# Patient Record
Sex: Male | Born: 1995
Health system: Southern US, Community
[De-identification: ages and names within clinical notes are randomized; demographics above are authoritative.]

## PROBLEM LIST (undated history)

## (undated) DIAGNOSIS — F419 Anxiety disorder, unspecified: Secondary | ICD-10-CM

## (undated) DIAGNOSIS — S42009A Fracture of unspecified part of unspecified clavicle, initial encounter for closed fracture: Secondary | ICD-10-CM

## (undated) DIAGNOSIS — F909 Attention-deficit hyperactivity disorder, unspecified type: Secondary | ICD-10-CM

---
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
## 2013-05-12 ENCOUNTER — Emergency Department: Payer: Self-pay | Admitting: Emergency Medicine

## 2015-11-23 ENCOUNTER — Ambulatory Visit (INDEPENDENT_AMBULATORY_CARE_PROVIDER_SITE_OTHER): Payer: BLUE CROSS/BLUE SHIELD | Admitting: Family Medicine

## 2015-11-23 ENCOUNTER — Encounter: Payer: Self-pay | Admitting: Family Medicine

## 2015-11-23 VITALS — BP 118/72 | HR 73 | Temp 98.0°F | Ht 70.5 in | Wt 181.4 lb

## 2015-11-23 DIAGNOSIS — Z114 Encounter for screening for human immunodeficiency virus [HIV]: Secondary | ICD-10-CM | POA: Diagnosis not present

## 2015-11-23 DIAGNOSIS — Z13220 Encounter for screening for lipoid disorders: Secondary | ICD-10-CM | POA: Diagnosis not present

## 2015-11-23 DIAGNOSIS — Z0001 Encounter for general adult medical examination with abnormal findings: Secondary | ICD-10-CM | POA: Diagnosis not present

## 2015-11-23 DIAGNOSIS — Z23 Encounter for immunization: Secondary | ICD-10-CM | POA: Diagnosis not present

## 2015-11-23 DIAGNOSIS — M5136 Other intervertebral disc degeneration, lumbar region: Secondary | ICD-10-CM | POA: Diagnosis not present

## 2015-11-23 DIAGNOSIS — R7989 Other specified abnormal findings of blood chemistry: Secondary | ICD-10-CM

## 2015-11-23 DIAGNOSIS — M51369 Other intervertebral disc degeneration, lumbar region without mention of lumbar back pain or lower extremity pain: Secondary | ICD-10-CM | POA: Insufficient documentation

## 2015-11-23 LAB — COMPREHENSIVE METABOLIC PANEL
ALT: 13 U/L (ref 0–53)
AST: 16 U/L (ref 0–37)
Albumin: 4.7 g/dL (ref 3.5–5.2)
Alkaline Phosphatase: 48 U/L (ref 39–117)
BILIRUBIN TOTAL: 0.5 mg/dL (ref 0.2–1.2)
BUN: 12 mg/dL (ref 6–23)
CHLORIDE: 104 meq/L (ref 96–112)
CO2: 29 mEq/L (ref 19–32)
CREATININE: 0.97 mg/dL (ref 0.40–1.50)
Calcium: 9.2 mg/dL (ref 8.4–10.5)
GFR: 124.38 mL/min (ref >60.00)
GLUCOSE: 99 mg/dL (ref 70–99)
Potassium: 4.4 mEq/L (ref 3.5–5.1)
Sodium: 140 mEq/L (ref 135–145)
Total Protein: 7.1 g/dL (ref 6.0–8.3)

## 2015-11-23 LAB — LIPID PANEL
CHOL/HDL RATIO: 4
Cholesterol: 148 mg/dL (ref 0–200)
HDL: 36.1 mg/dL — AB (ref >39.00)
NONHDL: 111.81
Triglycerides: 294 mg/dL — ABNORMAL HIGH (ref 0.0–149.0)
VLDL: 58.8 mg/dL — ABNORMAL HIGH (ref 0.0–40.0)

## 2015-11-23 LAB — LDL CHOLESTEROL, DIRECT: LDL DIRECT: 81 mg/dL

## 2015-11-23 NOTE — Assessment & Plan Note (Signed)
Overall quite healthy. Slightly overweight. Discussed diet and exercise. Tdap updated today. HIV checked. Lab work as outlined below.

## 2015-11-23 NOTE — Patient Instructions (Signed)
Nice to meet you. Please work on exercising.  We will check lab work today and call with the results.  If you develop worsening back pain, numbness, weakness, loss of bowel or bladder function, numbness between her legs, fevers, or any new or changing symptoms please seek medical attention.

## 2015-11-23 NOTE — Progress Notes (Signed)
Patient ID: James Long, male   DOB: Aug 02, 1995, 20 y.o.   MRN: [id number]  Tommi Rumps, MD Phone: [phone]  James Long is a 20 y.o. male who presents today for new patient visit and physical exam.  Patient reports only chronic medical issue is degenerative disc disease in his lower back. Has been evaluated by a chiropractor previously. Intermittently bothersome particularly when the weather changes. No radiation down his legs. No numbness, weakness, loss of bowel or bladder function, saddle anesthesia, fevers, or history of cancer. Typically doesn't take any medicines for this. Does not exercise. Plans to start soon. Diet is described as normal. Drinks more water than anything. Not much fast food or fried foods. Last tetanus vaccine was likely in middle school though he is unsure. He does not report getting one recently. No prior HIV screening. Drinks 4-5 alcoholic drinks a week on average. Quit smoking 8 days ago. Smoked about a pack a day for 4-5 years. No illicit drug use.  Active Ambulatory Problems    Diagnosis Date Noted  . Encounter for general adult medical examination with abnormal findings 11/23/2015  . DDD (degenerative disc disease), lumbar 11/23/2015   Resolved Ambulatory Problems    Diagnosis Date Noted  . No Resolved Ambulatory Problems   No Additional Past Medical History    History reviewed. No pertinent family history.  Social History   Social History  . Marital Status: Single    Spouse Name: N/A  . Number of Children: 0  . Years of Education: HS   Occupational History  . Salesman [employer]   Social History Main Topics  . Smoking status: Former Smoker -- 1.00 packs/day for 6 years    Types: Cigarettes    Quit date: 11/16/2015  . Smokeless tobacco: Never Used  . Alcohol Use: 0.0 oz/week    0 Standard drinks or equivalent per week     Comment: 4-5 week if any  . Drug Use: No  . Sexual Activity: Not on file   Other Topics  Concern  . Not on file   Social History Narrative  . No narrative on file    ROS  General:  Negative for nexplained weight loss, fever Skin: Negative for new or changing mole, sore that won't heal HEENT: Negative for trouble hearing, trouble seeing, ringing in ears, mouth sores, hoarseness, change in voice, dysphagia. CV:  Negative for chest pain, dyspnea, edema, palpitations Resp: Negative for cough, dyspnea, hemoptysis GI: Negative for nausea, vomiting, diarrhea, constipation, abdominal pain, melena, hematochezia. GU: Negative for dysuria, incontinence, urinary hesitance, hematuria, vaginal or penile discharge, polyuria, sexual difficulty, lumps in testicle or breasts MSK: Negative for muscle cramps or aches, joint pain or swelling Neuro: Negative for headaches, weakness, numbness, dizziness, passing out/fainting Psych: Negative for depression, anxiety, memory problems  Objective  Physical Exam Filed Vitals:   11/23/15 0805  BP: 118/72  Pulse: 73  Temp: 98 F (36.7 C)    BP Readings from Last 3 Encounters:  11/23/15 118/72   Wt Readings from Last 3 Encounters:  11/23/15 181 lb 6.4 oz (82.283 kg)    Physical Exam  Constitutional: He is well-developed, well-nourished, and in no distress.  HENT:  Head: Normocephalic and atraumatic.  Right Ear: External ear normal.  Left Ear: External ear normal.  Mouth/Throat: Oropharynx is clear and moist. No oropharyngeal exudate.  Eyes: Conjunctivae are normal. Pupils are equal, round, and reactive to light.  Cardiovascular: Normal rate, regular rhythm and normal heart sounds.  Pulmonary/Chest: Effort normal and breath sounds normal.  Abdominal: Soft. Bowel sounds are normal. He exhibits no distension. There is no tenderness. There is no rebound.  Musculoskeletal: He exhibits no edema.  No midline spine tenderness, no midline spine step-off, no muscular back tenderness, no skin changes and low back  Neurological: He is alert.  Gait normal.  5 out of 5 strength bilateral quads, hamstrings, plantar flexion, and dorsiflexion, sensation light touch intact bilaterally extremities, 2+ patellar reflexes  Skin: Skin is warm and dry. He is not diaphoretic.  Psychiatric: Mood and affect normal.     Assessment/Plan:   Encounter for general adult medical examination with abnormal findings Overall quite healthy. Slightly overweight. Discussed diet and exercise. Tdap updated today. HIV checked. Lab work as outlined below.  DDD (degenerative disc disease), lumbar Patient reports lumbar degenerative disc disease. Asymptomatic at this time. No red flags. Neurologically intact in lower extremities. He'll continue to monitor. Given return precautions.    Orders Placed This Encounter  Procedures  . Tdap vaccine greater than or equal to 7yo IM  . HIV antibody (with reflex)  . Comp Met (CMET)  . Lipid Profile    No orders of the defined types were placed in this encounter.     Tommi Rumps, MD [place]

## 2015-11-23 NOTE — Progress Notes (Signed)
Pre visit review using our clinic review tool, if applicable. No additional management support is needed unless otherwise documented below in the visit note. 

## 2015-11-23 NOTE — Assessment & Plan Note (Signed)
Patient reports lumbar degenerative disc disease. Asymptomatic at this time. No red flags. Neurologically intact in lower extremities. He'll continue to monitor. Given return precautions.

## 2015-11-24 LAB — HIV ANTIBODY (ROUTINE TESTING W REFLEX): HIV: NONREACTIVE

## 2015-11-28 ENCOUNTER — Encounter: Payer: Self-pay | Admitting: Family Medicine

## 2016-02-02 ENCOUNTER — Encounter (INDEPENDENT_AMBULATORY_CARE_PROVIDER_SITE_OTHER): Payer: Self-pay

## 2016-02-02 ENCOUNTER — Ambulatory Visit (INDEPENDENT_AMBULATORY_CARE_PROVIDER_SITE_OTHER): Payer: BLUE CROSS/BLUE SHIELD | Admitting: Family Medicine

## 2016-02-02 DIAGNOSIS — G479 Sleep disorder, unspecified: Secondary | ICD-10-CM | POA: Diagnosis not present

## 2016-02-02 NOTE — Progress Notes (Signed)
Pre visit review using our clinic review tool, if applicable. No additional management support is needed unless otherwise documented below in the visit note. 

## 2016-02-02 NOTE — Patient Instructions (Signed)
Nice to see you. Things you can do to help your sleep are take a warm shower prior to bed, set a set bedtime. Read prior to bed. And discuss your stresses and anxieties with someone close. If you develop depression or anxiety please let us know.

## 2016-02-04 DIAGNOSIS — G479 Sleep disorder, unspecified: Secondary | ICD-10-CM | POA: Insufficient documentation

## 2016-02-04 NOTE — Assessment & Plan Note (Signed)
Suspect likely related to recent stressors with one of his acquaintances. Discussed talking about this with those around him. Discussed sleep hygiene. Discussed if this does not improve could consider the therapist if it is anxiety related or medication such as SSRI. He will continue to monitor.

## 2016-02-04 NOTE — Progress Notes (Signed)
  Marikay Alar, MD Phone: [phone]  James Long is a 20 y.o. male who presents today for same-day visit.  Patient notes this past Sunday he had issues sleeping. Laid down at 11:30 PM and did not fall sleep. He had to get up around 6:30. The next night the same thing happened though he got up at 2 AM and drank a beer to help him sleep. Has not had issues last several nights. Has had issues with this in the past when he has had stressors related to male appointment wheezes. He notes similar stressors recently. No depression or anxiety. Typically does not drink alcohol before bed. Does not drink much caffeine. Tries to play guitar before he goes to bed to help calm himself down.   ROS see history of present illness  Objective  Physical Exam Vitals:   02/02/16 1614  BP: 110/74  Pulse: 89  Temp: 98.4 F (36.9 C)    BP Readings from Last 3 Encounters:  02/02/16 110/74  11/23/15 118/72   Wt Readings from Last 3 Encounters:  02/02/16 184 lb 3.2 oz (83.6 kg)  11/23/15 181 lb 6.4 oz (82.3 kg)    Physical Exam  Constitutional: He is well-developed, well-nourished, and in no distress.  HENT:  Head: Normocephalic and atraumatic.  Cardiovascular: Normal rate and regular rhythm.   Pulmonary/Chest: Effort normal and breath sounds normal.  Neurological: He is alert. Gait normal.  Psychiatric: Mood and affect normal.     Assessment/Plan: Please see individual problem list.  Sleeping difficulty Suspect likely related to recent stressors with one of his acquaintances. Discussed talking about this with those around him. Discussed sleep hygiene. Discussed if this does not improve could consider the therapist if it is anxiety related or medication such as SSRI. He will continue to monitor.   No orders of the defined types were placed in this encounter.   No orders of the defined types were placed in this encounter.   Marikay Alar, MD [facility]

## 2016-11-25 ENCOUNTER — Encounter: Payer: BLUE CROSS/BLUE SHIELD | Admitting: Family Medicine

## 2016-11-25 DIAGNOSIS — Z0289 Encounter for other administrative examinations: Secondary | ICD-10-CM

## 2017-04-25 ENCOUNTER — Encounter: Payer: Self-pay | Admitting: Family Medicine

## 2017-04-25 ENCOUNTER — Ambulatory Visit (INDEPENDENT_AMBULATORY_CARE_PROVIDER_SITE_OTHER): Payer: BLUE CROSS/BLUE SHIELD | Admitting: Family Medicine

## 2017-04-25 VITALS — BP 136/80 | HR 97 | Temp 97.9°F | Wt 160.8 lb

## 2017-04-25 DIAGNOSIS — R41840 Attention and concentration deficit: Secondary | ICD-10-CM | POA: Diagnosis not present

## 2017-04-25 DIAGNOSIS — F909 Attention-deficit hyperactivity disorder, unspecified type: Secondary | ICD-10-CM | POA: Insufficient documentation

## 2017-04-25 NOTE — Progress Notes (Signed)
  Marikay AlarEric Kavontae Pritchard, MD Phone: [phone]  James Long is a 21 y.o. male who presents today for same-day visit.  Patient reports issues with focusing and attention over the last several months. Notes he's had lots of stress at work and has to focus much more than he used to. 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 Has had trouble focusing at home as well. No prior evaluation for ADD. He denies anxiety and depression. He does note some grief at the loss of someone close to him earlier this year though no depression. No SI. He did take Vyvanse yesterday that he got from a friend and it did help significantly.  ROS see history of present illness  Objective  Physical Exam Vitals:   04/25/17 1316  BP: 136/80  Pulse: 97  Temp: 97.9 F (36.6 C)  SpO2: 97%    BP Readings from Last 3 Encounters:  04/25/17 136/80  02/02/16 110/74  11/23/15 118/72   Wt Readings from Last 3 Encounters:  04/25/17 160 lb 12.8 oz (72.9 kg)  02/02/16 184 lb 3.2 oz (83.6 kg)  11/23/15 181 lb 6.4 oz (82.3 kg)    Physical Exam  Constitutional: No distress.  Cardiovascular: Normal rate, regular rhythm and normal heart sounds.   Pulmonary/Chest: Effort normal and breath sounds normal.  Skin: He is not diaphoretic.     Assessment/Plan: Please see individual problem list.  Attention deficit Concern for attention deficit disorder. Denies anxiety and depression. We'll refer to psychology for ADD evaluation.   Orders Placed This Encounter  Procedures  . Ambulatory referral to Psychology    Referral Priority:   Routine    Referral Type:   Psychiatric    Referral Reason:   Specialty Services Required    Requested Specialty:   Psychology    Number of Visits Requested:   1   Marikay AlarEric Wava Kildow, MD [facility] [place]

## 2017-04-25 NOTE — Assessment & Plan Note (Signed)
Concern for attention deficit disorder. Denies anxiety and depression. We'll refer to psychology for ADD evaluation.

## 2017-04-25 NOTE — Patient Instructions (Signed)
Nice to see you. We'll get you referred to psychology for evaluation for attention deficit disorder.

## 2017-07-18 ENCOUNTER — Ambulatory Visit (INDEPENDENT_AMBULATORY_CARE_PROVIDER_SITE_OTHER): Payer: BLUE CROSS/BLUE SHIELD | Admitting: Psychology

## 2017-07-18 DIAGNOSIS — F900 Attention-deficit hyperactivity disorder, predominantly inattentive type: Secondary | ICD-10-CM | POA: Diagnosis not present

## 2017-07-18 DIAGNOSIS — F419 Anxiety disorder, unspecified: Secondary | ICD-10-CM

## 2017-07-18 DIAGNOSIS — F411 Generalized anxiety disorder: Secondary | ICD-10-CM

## 2017-07-19 ENCOUNTER — Telehealth: Payer: Self-pay | Admitting: Family Medicine

## 2017-07-19 NOTE — Telephone Encounter (Signed)
I received the below message from the patient's psychologist.  Please contact the patient to see if we can set him up for an office visit to discuss options for treatment of ADHD and anxiety.  Thanks.      "Patient seen for testing session. Patient presents with history of attention deficits, distractibility, poor organization, and physical restlessness. Frequent anxiety and possible emotional trauma may also be contributing to symptoms. Testing recommended to rule out ADHD as well as level of emotional discomfort. Tests conducted included the CNS Vital signs, along with ratings for depression, anxiety, and PTSD. Results indicated significant defects in attention and organization, along with significant worries related to task completion, time management, and relationships. Patient meets criterion for attention Deficit hyperactivity disorder (ADHD) Primarily Inattentive Presentation and Generalized anxiety disorder. Recommendations were to discuss medication options for attention and anxiety with Primary Care physician along with developing and organizational system and consideration of individual counseling for stress management.     Salvatore DecentSteven C. Altabet, Ph.D. "Marland Kitchen

## 2017-07-21 NOTE — Telephone Encounter (Signed)
Patient is scheduled   

## 2017-08-14 ENCOUNTER — Ambulatory Visit (INDEPENDENT_AMBULATORY_CARE_PROVIDER_SITE_OTHER): Payer: BLUE CROSS/BLUE SHIELD | Admitting: Family Medicine

## 2017-08-14 ENCOUNTER — Other Ambulatory Visit: Payer: Self-pay

## 2017-08-14 ENCOUNTER — Encounter: Payer: Self-pay | Admitting: Family Medicine

## 2017-08-14 DIAGNOSIS — F419 Anxiety disorder, unspecified: Secondary | ICD-10-CM | POA: Diagnosis not present

## 2017-08-14 DIAGNOSIS — F909 Attention-deficit hyperactivity disorder, unspecified type: Secondary | ICD-10-CM | POA: Diagnosis not present

## 2017-08-14 MED ORDER — LISDEXAMFETAMINE DIMESYLATE 30 MG PO CAPS
30.0000 mg | ORAL_CAPSULE | Freq: Every day | ORAL | 0 refills | Status: DC
Start: 2017-08-14 — End: 2017-12-15

## 2017-08-14 MED ORDER — LISDEXAMFETAMINE DIMESYLATE 30 MG PO CAPS: 30.0000 mg | ORAL_CAPSULE | Freq: Every day | ORAL | 0 refills | Status: DC

## 2017-08-14 NOTE — Assessment & Plan Note (Signed)
Patient has undergone psychological evaluation.  Found to have attention deficit disorder.  He would benefit from a trial of stimulant therapy.  We discussed multiple options and he wanted once daily dosing.  We will trial Vyvanse.  Discussed potential side effects.  If these develop he will let us know.  He will monitor his anxiety while on this.  Follow-up in 1 month.

## 2017-08-14 NOTE — Patient Instructions (Signed)
Nice to see you. We will try Vyvanse for your ADHD.  If you develop any of the side effects that we discussed please let us know. Please monitor your anxiety while taking this.  If he gets worse please let us know.

## 2017-08-14 NOTE — Progress Notes (Signed)
Marikay James Kennis Wissmann, MD Phone: [phone]  James Long is a 22 y.o. male who presents today for follow-up.  ADHD: Recently underwent evaluation with psychology.  Found to have ADHD.  Felt he could benefit from a medication trial.  He on one occasion took a dose of 30 mg of Vyvanse and found it very helpful.  He would be interested in trying that.  He has issues concentrating at home and blood pressure issues.  No palpitations.  He tolerated that dose of Vyvanse well.  Anxiety: Notes this came out while he was seeing the psychologist.  Has anxiety when talking to certain people at work and also when driving.  Also noted to have a little bit of PTSD due to childhood issues.  He does not have any nightmares or read those issues.  Does have some grief related to the loss of someone close to him last year though no depression.  Social History   Tobacco Use  Smoking Status Current Some Day Smoker  . Packs/day: 1.00  . Years: 6.00  . Pack years: 6.00  . Types: Cigarettes  . Last attempt to quit: 11/16/2015  . Years since quitting: 1.7  Smokeless Tobacco Never Used     ROS see history of present illness  Objective  Physical Exam Vitals:   08/14/17 0928  BP: 104/78  Pulse: 78  Temp: 98 F (36.7 C)  SpO2: 98%    BP Readings from Last 3 Encounters:  08/14/17 104/78  04/25/17 136/80  02/02/16 110/74   Wt Readings from Last 3 Encounters:  08/14/17 155 lb 3.2 oz (70.4 kg)  04/25/17 160 lb 12.8 oz (72.9 kg)  02/02/16 184 lb 3.2 oz (83.6 kg)    Physical Exam  Constitutional: No distress.  Cardiovascular: Normal rate, regular rhythm and normal heart sounds.  Pulmonary/Chest: Effort normal and breath sounds normal.  Musculoskeletal: He exhibits no edema.  Neurological: He is alert. Gait normal.  Skin: Skin is warm and dry. He is not diaphoretic.   Small hyperpigmented spot off the edge connected to his right iris at about the 4 o'clock position that is been present since he  was born and has been unchanged, he notes no issues with his eyes. He will monitor and if changing be evaluated.   Assessment/Plan: Please see individual problem list.  ADHD Patient has undergone psychological evaluation.  Found to have attention deficit disorder.  He would benefit from a trial of stimulant therapy.  We discussed multiple options and he wanted once daily dosing.  We will trial Vyvanse.  Discussed potential side effects.  If these develop he will let us know.  He will monitor his anxiety while on this.  Follow-up in 1 month.  Anxiety Patient with anxiety.  Discussed treatment options including therapy and medication.  He deferred this at this time as he feels like getting the ADHD under control may help his anxiety.  If his anxiety worsens he will let us know.   No orders of the defined types were placed in this encounter.   Meds ordered this encounter  Medications  . lisdexamfetamine (VYVANSE) 30 MG capsule    Sig: Take 1 capsule (30 mg total) by mouth daily.    Dispense:  30 capsule    Refill:  0  . lisdexamfetamine (VYVANSE) 30 MG capsule    Sig: Take 1 capsule (30 mg total) by mouth daily. Do not fill until 09/11/17    Dispense:  30 capsule    Refill:  0     Tommi Rumps, MD [place]

## 2017-08-14 NOTE — Assessment & Plan Note (Signed)
Patient with anxiety.  Discussed treatment options including therapy and medication.  He deferred this at this time as he feels like getting the ADHD under control may help his anxiety.  If his anxiety worsens he will let us know.

## 2017-09-12 ENCOUNTER — Encounter: Payer: Self-pay | Admitting: Family Medicine

## 2017-09-12 ENCOUNTER — Ambulatory Visit (INDEPENDENT_AMBULATORY_CARE_PROVIDER_SITE_OTHER): Payer: BLUE CROSS/BLUE SHIELD | Admitting: Family Medicine

## 2017-09-12 DIAGNOSIS — F909 Attention-deficit hyperactivity disorder, unspecified type: Secondary | ICD-10-CM | POA: Diagnosis not present

## 2017-09-12 DIAGNOSIS — F419 Anxiety disorder, unspecified: Secondary | ICD-10-CM

## 2017-09-12 MED ORDER — LISDEXAMFETAMINE DIMESYLATE 30 MG PO CAPS: 30.0000 mg | ORAL_CAPSULE | Freq: Every day | ORAL | 0 refills | Status: DC

## 2017-09-12 MED ORDER — LISDEXAMFETAMINE DIMESYLATE 30 MG PO CAPS
30.0000 mg | ORAL_CAPSULE | Freq: Every day | ORAL | 0 refills | Status: DC
Start: 2017-11-12 — End: 2017-12-15

## 2017-09-12 NOTE — Progress Notes (Signed)
  James AlarEric Iann Rodier, MD Phone: [phone]  James Long is a 22 y.o. male who presents today for follow-up.  ADHD: Taking Vyvanse and this is been very beneficial.  Has been much more productive at work.  Does not take on the weekends.  No palpitations, sleep changes, or appetite changes.   Anxiety has improved with starting ADHD treatment.   Social History   Tobacco Use  Smoking Status Former Smoker  . Packs/day: 1.00  . Years: 6.00  . Pack years: 6.00  . Types: Cigarettes  . Last attempt to quit: 11/16/2015  . Years since quitting: 1.8  Smokeless Tobacco Never Used     ROS see history of present illness  Objective  Physical Exam Vitals:   09/12/17 1519  BP: 118/84  Pulse: 89  Temp: 98.3 F (36.8 C)  SpO2: 99%    BP Readings from Last 3 Encounters:  09/12/17 118/84  08/14/17 104/78  04/25/17 136/80   Wt Readings from Last 3 Encounters:  09/12/17 151 lb 12.8 oz (68.9 kg)  08/14/17 155 lb 3.2 oz (70.4 kg)  04/25/17 160 lb 12.8 oz (72.9 kg)    Physical Exam  Constitutional: He is well-developed, well-nourished, and in no distress.  Cardiovascular: Normal rate, regular rhythm and normal heart sounds.  Pulmonary/Chest: Effort normal and breath sounds normal.  Skin: Skin is warm and dry.     Assessment/Plan: Please see individual problem list.  ADHD Continue Vyvanse.  Refill sent to pharmacy.  Anxiety Anxiety has improved significantly with treatment of his ADHD.  He will monitor.   No orders of the defined types were placed in this encounter.   Meds ordered this encounter  Medications  . lisdexamfetamine (VYVANSE) 30 MG capsule    Sig: Take 1 capsule (30 mg total) by mouth daily.    Dispense:  30 capsule    Refill:  0  . lisdexamfetamine (VYVANSE) 30 MG capsule    Sig: Take 1 capsule (30 mg total) by mouth daily.    Dispense:  30 capsule    Refill:  0     James AlarEric Jood Retana, MD [facility] [place]

## 2017-09-12 NOTE — Patient Instructions (Signed)
Nice to see you. We will continue your Vyvanse.  If you notice any side effects please let us know.

## 2017-09-12 NOTE — Assessment & Plan Note (Signed)
Continue Vyvanse.  Refill sent to pharmacy.

## 2017-09-12 NOTE — Assessment & Plan Note (Signed)
Anxiety has improved significantly with treatment of his ADHD.  He will monitor.

## 2017-12-14 ENCOUNTER — Encounter: Payer: Self-pay | Admitting: Emergency Medicine

## 2017-12-14 ENCOUNTER — Emergency Department
Admission: EM | Admit: 2017-12-14 | Discharge: 2017-12-14 | Disposition: A | Discharge status: Home / Self Care | Payer: BLUE CROSS/BLUE SHIELD | Attending: Emergency Medicine | Admitting: Emergency Medicine

## 2017-12-14 ENCOUNTER — Other Ambulatory Visit: Payer: Self-pay

## 2017-12-14 ENCOUNTER — Emergency Department: Payer: BLUE CROSS/BLUE SHIELD

## 2017-12-14 DIAGNOSIS — Y999 Unspecified external cause status: Secondary | ICD-10-CM | POA: Diagnosis not present

## 2017-12-14 DIAGNOSIS — S42032A Displaced fracture of lateral end of left clavicle, initial encounter for closed fracture: Secondary | ICD-10-CM | POA: Diagnosis not present

## 2017-12-14 DIAGNOSIS — S5002XA Contusion of left elbow, initial encounter: Secondary | ICD-10-CM | POA: Insufficient documentation

## 2017-12-14 DIAGNOSIS — T148XXA Other injury of unspecified body region, initial encounter: Secondary | ICD-10-CM | POA: Insufficient documentation

## 2017-12-14 DIAGNOSIS — Z23 Encounter for immunization: Secondary | ICD-10-CM | POA: Insufficient documentation

## 2017-12-14 DIAGNOSIS — S59902A Unspecified injury of left elbow, initial encounter: Secondary | ICD-10-CM | POA: Diagnosis not present

## 2017-12-14 DIAGNOSIS — S4992XA Unspecified injury of left shoulder and upper arm, initial encounter: Secondary | ICD-10-CM | POA: Diagnosis not present

## 2017-12-14 DIAGNOSIS — S60512A Abrasion of left hand, initial encounter: Secondary | ICD-10-CM | POA: Diagnosis not present

## 2017-12-14 DIAGNOSIS — Z87891 Personal history of nicotine dependence: Secondary | ICD-10-CM | POA: Insufficient documentation

## 2017-12-14 DIAGNOSIS — M25522 Pain in left elbow: Secondary | ICD-10-CM | POA: Diagnosis not present

## 2017-12-14 DIAGNOSIS — T07XXXA Unspecified multiple injuries, initial encounter: Secondary | ICD-10-CM

## 2017-12-14 DIAGNOSIS — Y929 Unspecified place or not applicable: Secondary | ICD-10-CM | POA: Diagnosis not present

## 2017-12-14 DIAGNOSIS — Y939 Activity, unspecified: Secondary | ICD-10-CM | POA: Diagnosis not present

## 2017-12-14 DIAGNOSIS — S50312A Abrasion of left elbow, initial encounter: Secondary | ICD-10-CM | POA: Diagnosis not present

## 2017-12-14 MED ORDER — HYDROCODONE-ACETAMINOPHEN 5-325 MG PO TABS: 1.0000 | ORAL_TABLET | Freq: Four times a day (QID) | ORAL | 0 refills | Status: DC | PRN

## 2017-12-14 MED ORDER — TETANUS-DIPHTH-ACELL PERTUSSIS 5-2.5-18.5 LF-MCG/0.5 IM SUSP
0.5000 mL | Freq: Once | INTRAMUSCULAR | Status: AC
Start: 2017-12-14 — End: 2017-12-14
  Administered 2017-12-14: 0.5 mL via INTRAMUSCULAR
  Filled 2017-12-14: qty 0.5

## 2017-12-14 MED ORDER — NAPROXEN 500 MG PO TABS: 500.0000 mg | ORAL_TABLET | Freq: Two times a day (BID) | ORAL | 0 refills | Status: DC

## 2017-12-14 MED ORDER — BACITRACIN ZINC 500 UNIT/GM EX OINT
TOPICAL_OINTMENT | CUTANEOUS | Status: AC
  Administered 2017-12-14: 09:00:00
  Filled 2017-12-14: qty 0.9
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
## 2017-12-14 NOTE — ED Notes (Signed)
Dressing and bacitracin applied by this RN. Clavicle straps applied by this RN and Pam, EDT. Pt tolerated well.

## 2017-12-14 NOTE — ED Notes (Signed)
NAD noted at time of D/C. Pt denies questions or concerns. Pt ambulatory to the lobby at this time with his friends.

## 2017-12-14 NOTE — Discharge Instructions (Addendum)
Call make a follow-up appointment with Dr. Allena KatzPatel his contact information is listed on your discharge papers.  Wear clavicle strap for support.  Clean abrasions daily with mild soap and water.  Watch for any signs of infection.  Take Norco every 6 hours as needed for pain.  Naproxen 500 mg twice daily with food for pain and inflammation.  Do not mix alcohol with these medications.  Do not take Norco while driving or operating machinery.  You may also use ice to your shoulder to reduce swelling and pain.

## 2017-12-15 ENCOUNTER — Ambulatory Visit (INDEPENDENT_AMBULATORY_CARE_PROVIDER_SITE_OTHER): Payer: BLUE CROSS/BLUE SHIELD | Admitting: Family Medicine

## 2017-12-15 ENCOUNTER — Encounter: Payer: Self-pay | Admitting: Family Medicine

## 2017-12-15 DIAGNOSIS — F909 Attention-deficit hyperactivity disorder, unspecified type: Secondary | ICD-10-CM | POA: Diagnosis not present

## 2017-12-15 DIAGNOSIS — F419 Anxiety disorder, unspecified: Secondary | ICD-10-CM | POA: Diagnosis not present

## 2017-12-15 DIAGNOSIS — S42032A Displaced fracture of lateral end of left clavicle, initial encounter for closed fracture: Secondary | ICD-10-CM

## 2017-12-15 DIAGNOSIS — S42009A Fracture of unspecified part of unspecified clavicle, initial encounter for closed fracture: Secondary | ICD-10-CM | POA: Insufficient documentation

## 2017-12-15 MED ORDER — LISDEXAMFETAMINE DIMESYLATE 40 MG PO CAPS: 40.0000 mg | ORAL_CAPSULE | Freq: Every day | ORAL | 0 refills | Status: DC

## 2017-12-15 NOTE — Assessment & Plan Note (Signed)
Adequately controlled.  He will monitor.
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
## 2017-12-15 NOTE — Assessment & Plan Note (Signed)
Slight worsening of control.  We will trial a slightly higher dose of Vyvanse at 40 mg daily.  He will contact us in a month if not beneficial.
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
## 2017-12-15 NOTE — Patient Instructions (Signed)
Nice to see you. We will change your Vyvanse dose to 40 mg daily. Please see orthopedics as planned for your clavicle fracture.

## 2017-12-16 DIAGNOSIS — M898X1 Other specified disorders of bone, shoulder: Secondary | ICD-10-CM | POA: Diagnosis not present

## 2017-12-16 DIAGNOSIS — S42022A Displaced fracture of shaft of left clavicle, initial encounter for closed fracture: Secondary | ICD-10-CM | POA: Diagnosis not present

## 2017-12-16 DIAGNOSIS — M25512 Pain in left shoulder: Secondary | ICD-10-CM | POA: Diagnosis not present

## 2017-12-17 ENCOUNTER — Inpatient Hospital Stay: Admission: RE | Admit: 2017-12-17 | Payer: Self-pay | Source: Ambulatory Visit

## 2017-12-17 ENCOUNTER — Other Ambulatory Visit: Payer: Self-pay

## 2017-12-19 ENCOUNTER — Ambulatory Visit: Payer: BLUE CROSS/BLUE SHIELD | Attending: Orthopedic Surgery

## 2017-12-19 ENCOUNTER — Ambulatory Visit
Admission: RE | Admit: 2017-12-19 | Discharge: 2017-12-19 | Disposition: A | Discharge status: Home / Self Care | Payer: BLUE CROSS/BLUE SHIELD | Source: Ambulatory Visit | Attending: Orthopedic Surgery | Admitting: Orthopedic Surgery

## 2017-12-19 ENCOUNTER — Ambulatory Visit: Payer: BLUE CROSS/BLUE SHIELD | Admitting: Anesthesiology

## 2017-12-19 ENCOUNTER — Encounter: Admission: RE | Payer: Self-pay | Source: Ambulatory Visit

## 2017-12-19 ENCOUNTER — Ambulatory Visit
Admission: RE | Admit: 2017-12-19 | Payer: BLUE CROSS/BLUE SHIELD | Source: Ambulatory Visit | Admitting: Orthopedic Surgery

## 2017-12-19 ENCOUNTER — Encounter
Admission: RE | Disposition: A | Discharge status: Home / Self Care | Payer: Self-pay | Source: Ambulatory Visit | Attending: Orthopedic Surgery

## 2017-12-19 ENCOUNTER — Ambulatory Visit (HOSPITAL_COMMUNITY): Payer: Self-pay

## 2017-12-19 DIAGNOSIS — S42022A Displaced fracture of shaft of left clavicle, initial encounter for closed fracture: Secondary | ICD-10-CM | POA: Insufficient documentation

## 2017-12-19 DIAGNOSIS — X58XXXA Exposure to other specified factors, initial encounter: Secondary | ICD-10-CM | POA: Diagnosis not present

## 2017-12-19 DIAGNOSIS — F909 Attention-deficit hyperactivity disorder, unspecified type: Secondary | ICD-10-CM | POA: Diagnosis not present

## 2017-12-19 DIAGNOSIS — S42009A Fracture of unspecified part of unspecified clavicle, initial encounter for closed fracture: Secondary | ICD-10-CM | POA: Insufficient documentation

## 2017-12-19 DIAGNOSIS — Y9289 Other specified places as the place of occurrence of the external cause: Secondary | ICD-10-CM | POA: Insufficient documentation

## 2017-12-19 DIAGNOSIS — M25512 Pain in left shoulder: Secondary | ICD-10-CM | POA: Diagnosis not present

## 2017-12-19 DIAGNOSIS — F419 Anxiety disorder, unspecified: Secondary | ICD-10-CM | POA: Insufficient documentation

## 2017-12-19 DIAGNOSIS — Z79899 Other long term (current) drug therapy: Secondary | ICD-10-CM | POA: Insufficient documentation

## 2017-12-19 DIAGNOSIS — S42022B Displaced fracture of shaft of left clavicle, initial encounter for open fracture: Secondary | ICD-10-CM | POA: Diagnosis not present

## 2017-12-19 DIAGNOSIS — M5136 Other intervertebral disc degeneration, lumbar region: Secondary | ICD-10-CM | POA: Insufficient documentation

## 2017-12-19 DIAGNOSIS — Z87891 Personal history of nicotine dependence: Secondary | ICD-10-CM | POA: Diagnosis not present

## 2017-12-19 DIAGNOSIS — G8918 Other acute postprocedural pain: Secondary | ICD-10-CM | POA: Diagnosis not present

## 2017-12-19 HISTORY — PX: SHOULDER ARTHROSCOPY WITH DISTAL CLAVICLE RESECTION: SHX5675

## 2017-12-19 HISTORY — DX: Attention-deficit hyperactivity disorder, unspecified type: F90.9

## 2017-12-19 HISTORY — DX: Anxiety disorder, unspecified: F41.9

## 2017-12-19 HISTORY — DX: Fracture of unspecified part of unspecified clavicle, initial encounter for closed fracture: S42.009A

## 2017-12-19 SURGERY — OPEN REDUCTION INTERNAL FIXATION (ORIF) CLAVICULAR FRACTURE
Anesthesia: Choice | Laterality: Left

## 2017-12-19 SURGERY — SHOULDER ARTHROSCOPY WITH DISTAL CLAVICLE RESECTION
Anesthesia: Regional | Site: Shoulder | Laterality: Left | Wound class: Clean

## 2017-12-19 MED ORDER — OXYCODONE HCL 5 MG/5ML PO SOLN: 5.0000 mg | Freq: Once | ORAL | Status: DC | PRN

## 2017-12-19 MED ORDER — OXYCODONE HCL 5 MG PO TABS: 5.0000 mg | ORAL_TABLET | ORAL | 0 refills | Status: DC | PRN

## 2017-12-19 MED ORDER — FENTANYL CITRATE (PF) 100 MCG/2ML IJ SOLN: 25.0000 ug | INTRAMUSCULAR | Status: DC | PRN

## 2017-12-19 MED ORDER — ASPIRIN EC 325 MG PO TBEC: 325.0000 mg | DELAYED_RELEASE_TABLET | Freq: Every day | ORAL | 0 refills | Status: AC

## 2017-12-19 MED ORDER — LIDOCAINE HCL (CARDIAC) PF 100 MG/5ML IV SOSY
PREFILLED_SYRINGE | INTRAVENOUS | Status: DC | PRN
  Administered 2017-12-19: 30 mg via INTRATRACHEAL

## 2017-12-19 MED ORDER — GLYCOPYRROLATE 0.2 MG/ML IJ SOLN
INTRAMUSCULAR | Status: DC | PRN
  Administered 2017-12-19: 1 mg via INTRAVENOUS

## 2017-12-19 MED ORDER — MIDAZOLAM HCL 5 MG/5ML IJ SOLN
INTRAMUSCULAR | Status: DC | PRN
  Administered 2017-12-19: 2 mg via INTRAVENOUS

## 2017-12-19 MED ORDER — ONDANSETRON 4 MG PO TBDP: 4.0000 mg | ORAL_TABLET | Freq: Three times a day (TID) | ORAL | 0 refills | Status: DC | PRN

## 2017-12-19 MED ORDER — PROPOFOL 10 MG/ML IV BOLUS
INTRAVENOUS | Status: DC | PRN
  Administered 2017-12-19: 200 mg via INTRAVENOUS

## 2017-12-19 MED ORDER — FENTANYL CITRATE (PF) 100 MCG/2ML IJ SOLN
INTRAMUSCULAR | Status: DC | PRN
  Administered 2017-12-19: 100 ug via INTRAVENOUS

## 2017-12-19 MED ORDER — CEFAZOLIN SODIUM-DEXTROSE 2-4 GM/100ML-% IV SOLN
2.0000 g | Freq: Once | INTRAVENOUS | Status: AC
  Administered 2017-12-19: 2 g via INTRAVENOUS

## 2017-12-19 MED ORDER — OXYCODONE HCL 5 MG PO TABS: 5.0000 mg | ORAL_TABLET | Freq: Once | ORAL | Status: DC | PRN

## 2017-12-19 MED ORDER — ROPIVACAINE HCL 5 MG/ML IJ SOLN
INTRAMUSCULAR | Status: DC | PRN
  Administered 2017-12-19: 40 mL via PERINEURAL

## 2017-12-19 MED ORDER — LACTATED RINGERS IV SOLN
10.0000 mL/h | INTRAVENOUS | Status: DC
  Administered 2017-12-19: 10 mL/h via INTRAVENOUS
  Administered 2017-12-19: 13:00:00 via INTRAVENOUS

## 2017-12-19 MED ORDER — ACETAMINOPHEN 500 MG PO TABS: 1000.0000 mg | ORAL_TABLET | Freq: Three times a day (TID) | ORAL | 2 refills | Status: DC

## 2017-12-19 MED ORDER — DEXAMETHASONE SODIUM PHOSPHATE 4 MG/ML IJ SOLN
INTRAMUSCULAR | Status: DC | PRN
  Administered 2017-12-19: 4 mg via PERINEURAL

## 2017-12-19 MED ORDER — ONDANSETRON HCL 4 MG/2ML IJ SOLN
INTRAMUSCULAR | Status: DC | PRN
  Administered 2017-12-19: 4 mg via INTRAVENOUS

## 2017-12-19 MED ORDER — PROMETHAZINE HCL 25 MG/ML IJ SOLN: 6.2500 mg | INTRAMUSCULAR | Status: DC | PRN

## 2017-12-19 SURGICAL SUPPLY — 48 items
2.3 bone screw ×2 IMPLANT
2.6mm drill for 3.5mm screws ×2 IMPLANT
3.5 bond screw 14 ×2 IMPLANT
3.5 bone screw ×4 IMPLANT
3.5 bone screw 12 ×6 IMPLANT
BLADE SURG 15 STRL LF DISP TIS (BLADE) ×1 IMPLANT
BLADE SURG 15 STRL SS (BLADE) ×1
CANISTER SUCT 1200ML W/VALVE (MISCELLANEOUS) ×2 IMPLANT
CHLORAPREP W/TINT 26ML (MISCELLANEOUS) ×2 IMPLANT
COVER LIGHT HANDLE UNIVERSAL (MISCELLANEOUS) ×4 IMPLANT
DERMABOND ADVANCED (GAUZE/BANDAGES/DRESSINGS) ×1
DERMABOND ADVANCED .7 DNX12 (GAUZE/BANDAGES/DRESSINGS) ×1 IMPLANT
DRAPE C-ARM XRAY 36X54 (DRAPES) ×4 IMPLANT
DRAPE IMP U-DRAPE 54X76 (DRAPES) ×4 IMPLANT
DRAPE INCISE IOBAN 66X45 STRL (DRAPES) ×2 IMPLANT
DRAPE SHEET LG 3/4 BI-LAMINATE (DRAPES) ×2 IMPLANT
DRSG OPSITE POSTOP 4X8 (GAUZE/BANDAGES/DRESSINGS) ×2 IMPLANT
DRSG TEGADERM 4X4.75 (GAUZE/BANDAGES/DRESSINGS) ×2 IMPLANT
ELECT REM PT RETURN 9FT ADLT (ELECTROSURGICAL) ×2
ELECTRODE REM PT RTRN 9FT ADLT (ELECTROSURGICAL) ×1 IMPLANT
GAUZE PETRO XEROFOAM 1X8 (MISCELLANEOUS) ×2 IMPLANT
GAUZE SPONGE 4X4 12PLY STRL (GAUZE/BANDAGES/DRESSINGS) ×2 IMPLANT
GLOVE BIO SURGEON STRL SZ7.5 (GLOVE) ×2 IMPLANT
GLOVE BIOGEL PI IND STRL 8 (GLOVE) ×1 IMPLANT
GLOVE BIOGEL PI INDICATOR 8 (GLOVE) ×1
GOWN STRL REUS W/ TWL LRG LVL3 (GOWN DISPOSABLE) ×1 IMPLANT
GOWN STRL REUS W/TWL LRG LVL3 (GOWN DISPOSABLE) ×1
GOWN STRL REUS W/TWL LRG LVL4 (GOWN DISPOSABLE) ×2 IMPLANT
KIT STABILIZATION SHOULDER (MISCELLANEOUS) ×2 IMPLANT
KIT TURNOVER KIT A (KITS) ×2 IMPLANT
MASK FACE SPIDER DISP (MASK) ×2 IMPLANT
NEEDLE HYPO 21X1.5 SAFETY (NEEDLE) ×2 IMPLANT
NS IRRIG 500ML POUR BTL (IV SOLUTION) ×4 IMPLANT
PACK ARTHROSCOPY SHOULDER (MISCELLANEOUS) ×2 IMPLANT
PAD ABD DERMACEA PRESS 5X9 (GAUZE/BANDAGES/DRESSINGS) ×2 IMPLANT
PAD WRAPON POLAR SHDR UNIV (MISCELLANEOUS) ×1 IMPLANT
PENCIL SMOKE EVACUATOR (MISCELLANEOUS) ×2 IMPLANT
SLING ARM M TX990204 (SOFTGOODS) ×2 IMPLANT
STRIP CLOSURE SKIN 1/2X4 (GAUZE/BANDAGES/DRESSINGS) ×2 IMPLANT
SUT MNCRL 4-0 (SUTURE) ×1
SUT MNCRL 4-0 27XMFL (SUTURE) ×1
SUT VIC AB 0 CT1 36 (SUTURE) ×2 IMPLANT
SUT VIC AB 3-0 SH 27 (SUTURE) ×1
SUT VIC AB 3-0 SH 27X BRD (SUTURE) ×1 IMPLANT
SUTURE MNCRL 4-0 27XMF (SUTURE) ×1 IMPLANT
WRAPON POLAR PAD SHDR UNIV (MISCELLANEOUS) ×2
drill bit ×4 IMPLANT
superior midshaft plate increased curvature-standa ×2 IMPLANT

## 2017-12-19 NOTE — Anesthesia Preprocedure Evaluation (Addendum)
Anesthesia Evaluation  Patient identified by MRN, date of birth, ID band Patient awake    Reviewed: Allergy & Precautions, NPO status , Patient's Chart, lab work & pertinent test results  Airway Mallampati: I  TM Distance: >3 FB     Dental no notable dental hx.    Pulmonary Current Smoker ( (vape)),    breath sounds clear to auscultation       Cardiovascular Exercise Tolerance: Good negative cardio ROS   Rhythm:Regular Rate:Normal     Neuro/Psych Anxiety ADHD   GI/Hepatic negative GI ROS, neg GERD  ,  Endo/Other  negative endocrine ROS  Renal/GU      Musculoskeletal  (+) Arthritis , Clavicle fracture   Abdominal   Peds  Hematology negative hematology ROS (+)   Anesthesia Other Findings   Reproductive/Obstetrics                            Anesthesia Physical Anesthesia Plan  ASA: II  Anesthesia Plan: General and Regional   Post-op Pain Management:  Regional for Post-op pain   Induction: Intravenous  PONV Risk Score and Plan: Ondansetron  Airway Management Planned:   Additional Equipment:   Intra-op Plan:   Post-operative Plan:   Informed Consent: I have reviewed the patients History and Physical, chart, labs and discussed the procedure including the risks, benefits and alternatives for the proposed anesthesia with the patient or authorized representative who has indicated his/her understanding and acceptance.   Dental advisory given  Plan Discussed with: CRNA  Anesthesia Plan Comments:         Anesthesia Quick Evaluation

## 2017-12-19 NOTE — Anesthesia Procedure Notes (Signed)
Anesthesia Regional Block: Interscalene brachial plexus block   Pre-Anesthetic Checklist: ,, timeout performed, Correct Patient, Correct Site, Correct Laterality, Correct Procedure, Correct Position, site marked, Risks and benefits discussed,  Surgical consent,  Pre-op evaluation,  At surgeon's request and post-op pain management  Laterality: Left  Prep: chloraprep       Needles:  Injection technique: Single-shot  Needle Type: Stimiplex     Needle Length: 5cm  Needle Gauge: 22     Additional Needles:   Procedures:,,,, ultrasound used (permanent image in chart),,,,  Narrative:  Start time: 12/19/2017 10:41 AM End time: 12/19/2017 10:56 AM Injection made incrementally with aspirations every 5 mL.  Performed by: Personally  Anesthesiologist: Jola BabinskiHarker, Amariyah Bazar, MD

## 2017-12-19 NOTE — Anesthesia Procedure Notes (Signed)
Procedure Name: LMA Insertion Date/Time: 12/19/2017 11:38 AM Performed by: Maree KrabbeWarren, Nevaya Nagele, CRNA Pre-anesthesia Checklist: Patient identified, Emergency Drugs available, Suction available, Timeout performed and Patient being monitored Patient Re-evaluated:Patient Re-evaluated prior to induction Oxygen Delivery Method: Circle system utilized Preoxygenation: Pre-oxygenation with 100% oxygen Induction Type: IV induction LMA: LMA inserted LMA Size: 4.0 Number of attempts: 1 Placement Confirmation: positive ETCO2 and breath sounds checked- equal and bilateral Tube secured with: Tape

## 2017-12-19 NOTE — Discharge Instructions (Signed)
James H. Patel, MD ° [facility] ° Phone: [phone] ° Fax: [phone] ° ° °Discharge Instructions after ORIF Clavicle ° °  °1. Activity/Sling: You are to be non-weight bearing on operative extremity. A sling/shoulder immobilizer has been provided for you. Only remove the sling to perform elbow, wrist, and hand RoM exercises and hygiene/dressing. Active reaching and lifting are not permitted. You will be given further instructions on sling use at your first physical therapy visit and postoperative visit with Dr. Patel.  ° °2. Dressings: Dressing may be removed at 1st physical therapy visit (~3-4 days after surgery). Afterwards, you may either leave open to air (if no drainage) or cover with dry, sterile dressing. If you have steri-strips on your wound, please do not remove them. They will fall off on their own. You may shower 5 days after surgery. Please pat incision dry. Do not rub or place any shear forces across incision. If there is drainage or any opening of incision after 5 days, please notify our offices immediately.  °  °3. Driving:  Plan on not driving for six weeks. Please note that you are advised NOT to drive while taking narcotic pain medications as you may be impaired and unsafe to drive. °  °4. Medications:  °- You have been provided a prescription for narcotic pain medicine (usually oxycodone). After surgery, take 1-2 narcotic tablets every 4 hours if needed for severe pain. Please start this as soon as you begin to start having pain (if you received a nerve block, start taking as soon as this wears off or before bed).  °- A prescription for anti-nausea medication will be provided in case the narcotic medicine causes nausea - take 1 tablet every 6 hours only if nauseated.  °- Take enteric coated aspirin 325 mg once daily for 2 weeks to prevent blood clots. Do not take aspirin if you have an aspirin sensitivity/allergy or asthma or are on an anticoagulant (blood thinner) already. If so, then  your home anticoagulant will be resume and managed - do not take aspirin. °-Take tylenol 1000mg (2 Extra strength or 3 regular strength tablets) every 8 hours for pain. This will reduce the amount of narcotic medication needed. May stop tylenol when you are having minimal pain. °- Take a stool softener (Colace, Dulcolax or Senakot) if you are using narcotic pain medications to help with constipation that is associated with narcotic use. °- DO NOT take ANY nonsteroidal anti-inflammatory pain medications: °Advil, Motrin, Ibuprofen, Aleve, Naproxen, or Naprosyn.  ° °If you are taking prescription medication for anxiety, depression, insomnia, muscle spasm, chronic pain, or for attention deficit disorder you are advised that you are at a higher risk of adverse effects with use of narcotics post-op, including narcotic addiction/dependence, depressed breathing, death. °If you use non-prescribed substances: alcohol, marijuana, cocaine, heroin, methamphetamines, etc., you are at a higher risk of adverse effects with use of narcotics post-op, including narcotic addiction/dependence, depressed breathing, death. °You are advised that taking > 50 morphine milligram equivalents (MME) of narcotic pain medication per day results in twice the risk of overdose or death. For your prescription provided: oxycodone 5 mg - taking more than 6 tablets per day after the first few days of surgery. °  °5. Physical Therapy: 1-2 times per week for ~12 weeks. Therapy typically starts on post operative Day 3 or 4. You have been provided an order for physical therapy. The therapist will provide home exercises. Please contact our offices if this appointment has not been   scheduled.  °  °6. Work: May do light duty/desk job in approximately 1-2 weeks when off of narcotics, pain is well-controlled, and swelling has decreased if able to function with one arm in sling. Full work may take 6 weeks if light motions and function of both arms is required.  Lifting jobs may require 12 weeks. °  °7. Post-Op Appointments: °Your first post-op appointment will be with Dr. Patel in approximately 2 weeks time.  °  °If you find that they have not been scheduled please call the Orthopaedic Appointment front desk at [phone]. ° ° ° °General Anesthesia, Adult, Care After °These instructions provide you with information about caring for yourself after your procedure. Your health care provider may also give you more specific instructions. Your treatment has been planned according to current medical practices, but problems sometimes occur. Call your health care provider if you have any problems or questions after your procedure. °What can I expect after the procedure? °After the procedure, it is common to have: °· Vomiting. °· A sore throat. °· Mental slowness. ° °It is common to feel: °· Nauseous. °· Cold or shivery. °· Sleepy. °· Tired. °· Sore or achy, even in parts of your body where you did not have surgery. ° °Follow these instructions at home: °For at least 24 hours after the procedure: °· Do not: °? Participate in activities where you could fall or become injured. °? Drive. °? Use heavy machinery. °? Drink alcohol. °? Take sleeping pills or medicines that cause drowsiness. °? Make important decisions or sign legal documents. °? Take care of children on your own. °· Rest. °Eating and drinking °· If you vomit, drink water, juice, or soup when you can drink without vomiting. °· Drink enough fluid to keep your urine clear or pale yellow. °· Make sure you have little or no nausea before eating solid foods. °· Follow the diet recommended by your health care provider. °General instructions °· Have a responsible adult stay with you until you are awake and alert. °· Return to your normal activities as told by your health care provider. Ask your health care provider what activities are safe for you. °· Take over-the-counter and prescription medicines only as told by your health  care provider. °· If you smoke, do not smoke without supervision. °· Keep all follow-up visits as told by your health care provider. This is important. °Contact a health care provider if: °· You continue to have nausea or vomiting at home, and medicines are not helpful. °· You cannot drink fluids or start eating again. °· You cannot urinate after 8-12 hours. °· You develop a skin rash. °· You have fever. °· You have increasing redness at the site of your procedure. °Get help right away if: °· You have difficulty breathing. °· You have chest pain. °· You have unexpected bleeding. °· You feel that you are having a life-threatening or urgent problem. °This information is not intended to replace advice given to you by your health care provider. Make sure you discuss any questions you have with your health care provider. °Document Released: 09/30/2000 Document Revised: 11/27/2015 Document Reviewed: 06/08/2015 °[employer] Interactive Patient Education © 2018 [employer] ° °

## 2017-12-19 NOTE — Progress Notes (Deleted)
Patient was doing very well in recovery after hip surgery. Received bupivacaine and Exparel at surgical site by Dr. Allena KatzPatel, fentanyl, oxycodone, ketorolac and IV acetaminophen. Suddenly in 10/10 pain after about an hour in recovery, stating that morphine and oxycodone do not work for her. Dr. Allena KatzPatel is aware and will not be prescribing hydromorphone, which is what the patient is asking for. Patient desired to be discharged so that she could smoke, stating that smoking would help her more than anything else. On leaving the facility, the patient was screaming and told the PACU RN that she would be going to the emergency room at [facility].  Jola BabinskiElsje Felicitas Sine, MD

## 2017-12-19 NOTE — Transfer of Care (Signed)
Immediate Anesthesia Transfer of Care Note  Patient: James Long  Procedure(s) Performed: LEFT CLAVICLE OPEN REDUCTION INTERNAL FIXATION (Left Shoulder)  Patient Location: PACU  Anesthesia Type: General, Regional  Level of Consciousness: awake, alert  and patient cooperative  Airway and Oxygen Therapy: Patient Spontanous Breathing and Patient connected to supplemental oxygen  Post-op Assessment: Post-op Vital signs reviewed, Patient's Cardiovascular Status Stable, Respiratory Function Stable, Patent Airway and No signs of Nausea or vomiting  Post-op Vital Signs: Reviewed and stable  Complications: No apparent anesthesia complications

## 2017-12-19 NOTE — Anesthesia Postprocedure Evaluation (Signed)
Anesthesia Post Note  Patient: James Long  Procedure(s) Performed: LEFT CLAVICLE OPEN REDUCTION INTERNAL FIXATION (Left Shoulder)  Patient location during evaluation: PACU Anesthesia Type: Regional and General Level of consciousness: awake Pain management: pain level controlled Vital Signs Assessment: post-procedure vital signs reviewed and stable Respiratory status: respiratory function stable Cardiovascular status: stable Postop Assessment: no signs of nausea or vomiting Anesthetic complications: no    Jola BabinskiElsje Aerabella Galasso

## 2017-12-19 NOTE — H&P (Signed)
Paper H&P to be scanned into permanent record. H&P reviewed. No significant changes noted.  
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
## 2017-12-19 NOTE — Progress Notes (Signed)
Assisted James Long, ANMD with left, ultrasound guided, supraclavicular block. Side rails up, monitors on throughout procedure. See vital signs in flow sheet. Tolerated Procedure well.  

## 2017-12-22 ENCOUNTER — Encounter: Payer: Self-pay | Admitting: Orthopedic Surgery

## 2017-12-22 DIAGNOSIS — M25612 Stiffness of left shoulder, not elsewhere classified: Secondary | ICD-10-CM | POA: Diagnosis not present

## 2017-12-22 DIAGNOSIS — M6281 Muscle weakness (generalized): Secondary | ICD-10-CM | POA: Diagnosis not present

## 2017-12-22 DIAGNOSIS — M25512 Pain in left shoulder: Secondary | ICD-10-CM | POA: Diagnosis not present

## 2017-12-25 ENCOUNTER — Telehealth: Payer: Self-pay | Admitting: Family Medicine
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
## 2017-12-25 NOTE — Telephone Encounter (Signed)
Printed - waiting on signature. 

## 2017-12-25 NOTE — Telephone Encounter (Signed)
Ok to create letter.

## 2017-12-25 NOTE — Telephone Encounter (Signed)
Please create the letter then I can sign it.

## 2017-12-25 NOTE — Telephone Encounter (Signed)
Please advise 

## 2017-12-26 ENCOUNTER — Telehealth: Payer: Self-pay | Admitting: *Deleted

## 2017-12-26 NOTE — Telephone Encounter (Signed)
faxed

## 2017-12-26 NOTE — Telephone Encounter (Signed)
Please advise 

## 2017-12-26 NOTE — Telephone Encounter (Signed)
Patient notified

## 2017-12-26 NOTE — Telephone Encounter (Signed)
Copied from CRM [phone]. Topic: Quick Communication - See Telephone Encounter >> Dec 26, 2017 12:49 PM Cipriano BunkerLambe, Annette S wrote: CRM for notification. See Telephone encounter for: 12/26/17.  He is trying to get back to work. He is needing a letter faxed releasing him to work on Mon 6/24 with restrictions. No lifting, doing computer work whatever dr. Fredna DowSays.  Fax to [phone]   Today if possible.    Please call pt. When faxed.

## 2017-12-26 NOTE — Telephone Encounter (Signed)
This needs to come from his orthopedist. He had a surgery for his clavicle fracture and they will have to be the ones to release him.

## 2018-01-02 DIAGNOSIS — M25512 Pain in left shoulder: Secondary | ICD-10-CM | POA: Diagnosis not present

## 2018-01-30 DIAGNOSIS — Z8781 Personal history of (healed) traumatic fracture: Secondary | ICD-10-CM | POA: Diagnosis not present

## 2018-03-17 ENCOUNTER — Other Ambulatory Visit: Payer: Self-pay | Admitting: Family Medicine

## 2018-03-17 NOTE — Telephone Encounter (Signed)
Rx refill request: Vyvanse 40 mg      Last filled: 12/15/17 ( patient gets 3 Rx)  LOV: 12/15/17   PCP: Birdie Sons  Pharmacy: verified

## 2018-03-17 NOTE — Telephone Encounter (Signed)
Copied from CRM [id number]. Topic: Quick Communication - Rx Refill/Question >> Mar 17, 2018  1:33 PM Oneal Grout wrote: Medication: lisdexamfetamine (VYVANSE) 40 MG capsule  Has the patient contacted their pharmacy? Yes.   (Agent: If no, request that the patient contact the pharmacy for the refill.) (Agent: If yes, when and what did the pharmacy advise?)  Preferred Pharmacy (with phone number or street name): [facility] [address]  Agent: Please be advised that RX refills may take up to 3 business days. We ask that you follow-up with your pharmacy.  Patient requesting call once sent

## 2018-03-19 ENCOUNTER — Other Ambulatory Visit: Payer: Self-pay | Admitting: Family Medicine

## 2018-03-19 DIAGNOSIS — F909 Attention-deficit hyperactivity disorder, unspecified type: Secondary | ICD-10-CM

## 2018-03-19 MED ORDER — LISDEXAMFETAMINE DIMESYLATE 40 MG PO CAPS: 40.0000 mg | ORAL_CAPSULE | Freq: Every day | ORAL | 0 refills | Status: DC

## 2018-03-19 NOTE — Telephone Encounter (Signed)
rx refill request

## 2018-03-19 NOTE — Progress Notes (Signed)
PMP [place] aware checked and Rx refill appropriate. LG 

## 2018-03-19 NOTE — Telephone Encounter (Signed)
PMP [place] aware checked and Rx refill appropriate. LG 

## 2018-03-30 ENCOUNTER — Encounter: Payer: Self-pay | Admitting: Family Medicine

## 2018-03-30 ENCOUNTER — Ambulatory Visit (INDEPENDENT_AMBULATORY_CARE_PROVIDER_SITE_OTHER): Payer: BLUE CROSS/BLUE SHIELD | Admitting: Family Medicine

## 2018-03-30 DIAGNOSIS — S42032A Displaced fracture of lateral end of left clavicle, initial encounter for closed fracture: Secondary | ICD-10-CM

## 2018-03-30 DIAGNOSIS — F909 Attention-deficit hyperactivity disorder, unspecified type: Secondary | ICD-10-CM | POA: Diagnosis not present

## 2018-03-30 MED ORDER — LISDEXAMFETAMINE DIMESYLATE 40 MG PO CAPS: 40.0000 mg | ORAL_CAPSULE | Freq: Every day | ORAL | 0 refills | Status: DC

## 2018-03-30 NOTE — Assessment & Plan Note (Signed)
Well-controlled.  His anxiety is good with treatment of his ADHD.  We will continue Vyvanse at this time.  If his insurance would prefer an alternative medication he will let us know.

## 2018-03-30 NOTE — Progress Notes (Signed)
  James AlarEric Fedor Kazmierski, James Long Phone: [phone]  James Long is a 22 y.o. male who presents today for f/u.  CC: ADHD, clavicle fracture  ADHD Medication: vyvanse Effectiveness: this is effective Palpitations: no Sleep difficulty: no Appetite suppression: no He is going to be switching insurance plans later this year.  He will let us know if his insurance would prefer a different stimulant.  Left clavicle fracture: Patient underwent open reduction internal fixation of this.  He has done a lot better.  His surgeon has released him for activity.  He notes it feels good most of the time unless he sleeps on the side and then he will have some discomfort.   Social History   Tobacco Use  Smoking Status Current Every Day Smoker  . Packs/day: 0.00  . Years: 6.00  . Pack years: 0.00  . Types: E-cigarettes  . Last attempt to quit: 2019  . Years since quitting: 0.7  Smokeless Tobacco Never Used  Tobacco Comment   1 pod a day nicotin free      ROS see history of present illness  Objective  Physical Exam Vitals:   03/30/18 0900  BP: 112/70  Pulse: 90  Temp: 98.3 F (36.8 C)  SpO2: 99%    BP Readings from Last 3 Encounters:  03/30/18 112/70  12/19/17 112/69  12/15/17 112/62   Wt Readings from Last 3 Encounters:  03/30/18 158 lb 9.6 oz (71.9 kg)  12/19/17 157 lb (71.2 kg)  12/15/17 152 lb 6.4 oz (69.1 kg)    Physical Exam  Constitutional: No distress.  Cardiovascular: Normal rate, regular rhythm and normal heart sounds.  Pulmonary/Chest: Effort normal and breath sounds normal.  Musculoskeletal: He exhibits no edema.       Arms: Neurological: He is alert.  Skin: Skin is warm and dry. He is not diaphoretic.     Assessment/Plan: Please see individual problem list.  Clavicle fracture Healing well after surgery.  He will continue to monitor.  ADHD Well-controlled.  His anxiety is good with treatment of his ADHD.  We will continue Vyvanse at this time.  If his  insurance would prefer an alternative medication he will let us know.   No orders of the defined types were placed in this encounter.   Meds ordered this encounter  Medications  . lisdexamfetamine (VYVANSE) 40 MG capsule    Sig: Take 1 capsule (40 mg total) by mouth daily.    Dispense:  30 capsule    Refill:  0  . lisdexamfetamine (VYVANSE) 40 MG capsule    Sig: Take 1 capsule (40 mg total) by mouth daily.    Dispense:  30 capsule    Refill:  0  . lisdexamfetamine (VYVANSE) 40 MG capsule    Sig: Take 1 capsule (40 mg total) by mouth daily.    Dispense:  30 capsule    Refill:  0     James AlarEric Ariele Vidrio, [facility] Long [facility]

## 2018-03-30 NOTE — Patient Instructions (Signed)
Nice to see you. Please continue with the Vyvanse.  Recheck with your insurance and they would prefer a different medication please let us know.

## 2018-03-30 NOTE — Assessment & Plan Note (Signed)
Healing well after surgery.  He will continue to monitor.

## 2018-07-03 ENCOUNTER — Other Ambulatory Visit: Payer: Self-pay

## 2018-07-03 ENCOUNTER — Encounter: Payer: Self-pay | Admitting: Family Medicine

## 2018-07-03 ENCOUNTER — Ambulatory Visit (INDEPENDENT_AMBULATORY_CARE_PROVIDER_SITE_OTHER): Payer: BLUE CROSS/BLUE SHIELD | Admitting: Family Medicine

## 2018-07-03 DIAGNOSIS — F909 Attention-deficit hyperactivity disorder, unspecified type: Secondary | ICD-10-CM | POA: Diagnosis not present

## 2018-07-03 DIAGNOSIS — F419 Anxiety disorder, unspecified: Secondary | ICD-10-CM

## 2018-07-03 NOTE — Patient Instructions (Signed)
Nice to see you. I will check with our clinical pharmacist regarding your ADHD medication.  We will contact you regarding this.

## 2018-07-03 NOTE — Assessment & Plan Note (Signed)
Asymptomatic. He will monitor. ?

## 2018-07-03 NOTE — Progress Notes (Signed)
  Marikay AlarEric Naftali Carchi, MD Phone: [phone]  James Long is a 22 y.o. male who presents today for follow-up.  CC: ADHD, anxiety  ADHD: Taking Vyvanse.  He notes this is not as effective as it had been.  He is noted this over the last 2 to 3 weeks.  This is about the time course for the 30 mg dose became ineffective as well.  He is currently on 40 mg of Vyvanse.  No appetite suppression, sleep changes, or palpitations.  No anxiety or depression.  Social History   Tobacco Use  Smoking Status Current Every Day Smoker  . Packs/day: 0.00  . Years: 6.00  . Pack years: 0.00  . Types: E-cigarettes  . Last attempt to quit: 2019  . Years since quitting: 0.9  Smokeless Tobacco Never Used  Tobacco Comment   1 pod a day nicotin free      ROS see history of present illness  Objective  Physical Exam Vitals:   07/03/18 1550 07/03/18 1602  BP: 130/88 132/78  Pulse: 99   Temp: 98.1 F (36.7 C)   SpO2: 100%     BP Readings from Last 3 Encounters:  07/03/18 132/78  03/30/18 112/70  12/19/17 112/69   Wt Readings from Last 3 Encounters:  07/03/18 163 lb 12.8 oz (74.3 kg)  03/30/18 158 lb 9.6 oz (71.9 kg)  12/19/17 157 lb (71.2 kg)    Physical Exam Constitutional:      General: He is not in acute distress.    Appearance: He is not diaphoretic.  Cardiovascular:     Rate and Rhythm: Normal rate and regular rhythm.     Heart sounds: Normal heart sounds.  Pulmonary:     Effort: Pulmonary effort is normal.     Breath sounds: Normal breath sounds.  Skin:    General: Skin is warm and dry.  Neurological:     Mental Status: He is alert.      Assessment/Plan: Please see individual problem list.  Anxiety Asymptomatic.  He will monitor.  ADHD Vyvanse 40 mg has become less effective.  This is about the same time course that the 30 mg dose became ineffective.  I will check with our clinical pharmacist to see if she thinks an increased dose versus an alternative medication  would be more beneficial given progressive ineffectiveness of higher doses of Vyvanse.  We will contact the patient once we hear back from her.   No orders of the defined types were placed in this encounter.   No orders of the defined types were placed in this encounter.    Marikay AlarEric Suren Payne, MD [facility] [place] [facility]

## 2018-07-03 NOTE — Assessment & Plan Note (Signed)
Vyvanse 40 mg has become less effective.  This is about the same time course that the 30 mg dose became ineffective.  I will check with our clinical pharmacist to see if she thinks an increased dose versus an alternative medication would be more beneficial given progressive ineffectiveness of higher doses of Vyvanse.  We will contact the patient once we hear back from her.

## 2018-07-07 ENCOUNTER — Telehealth: Payer: Self-pay | Admitting: Family Medicine

## 2018-07-07 NOTE — Telephone Encounter (Signed)
Please let the patient know that I heard back from our clinical pharmacist. She recommended changing to an alternative medication called methylphenidate for his ADHD. If he is willing to make this change I can send this to his pharmacy. Thanks.

## 2018-07-07 NOTE — Telephone Encounter (Signed)
-----   Message from Lourena SimmondsCatherine E Travis, [facility] sent at 07/05/2018  8:08 PM EST ----- Elvina SidleHey -   I've done some digging, and can't find anything definitive recommending increasing the Vyvanse vs changing to an alternative stimulant. Changing to an alternative, like methylphenidate, would be my recommendation. I don't want to keep increasing the Vyvanse and exacerbate his anxiety.   Catie ----- Message ----- From: Glori LuisSonnenberg, Agapito Hanway G, MD Sent: 07/03/2018   4:16 PM EST To: Lourena Simmondsatherine E Travis, RPH  Hi Catie,   Hope you had a good time off. This patient has been on vyvanse for about 9 months. We started on 30 mg and it progressively became ineffective. He has been on 40 mg and it has become progressively ineffective. Each time the medication was effective for several months. Not sure if this is in your wheelhouse, though I wanted to see what you thought about this. Typically I've seen patients either respond well initially or not respond I initially rather than have progressive ineffectiveness. Would it be worthwhile increasing the dose given the progressive ineffectiveness with prior dose increases or would it be best to switch to an alternative stimulant? Thanks for the help.  Minerva AreolaEric

## 2018-07-09 MED ORDER — METHYLPHENIDATE HCL ER (OSM) 18 MG PO TBCR: 18.0000 mg | EXTENDED_RELEASE_TABLET | Freq: Every day | ORAL | 0 refills | Status: DC

## 2018-07-09 NOTE — Telephone Encounter (Signed)
Called and spoke with patient. Pt advised and voiced understanding. Pt would like Rx to be sent into [facility] in [address] and [address].  Pt would like to be notified once Rx has been sent.

## 2018-07-09 NOTE — Addendum Note (Signed)
Addended by: Glori Luis on: 07/09/2018 04:29 PM   Modules accepted: Orders

## 2018-07-09 NOTE — Telephone Encounter (Signed)
Methylphenidate sent to pharmacy.  Patient should start on this the day after discontinuing the Vyvanse.  Follow-up with me in 1 month.  It is okay for a 430 appointment.

## 2018-07-10 NOTE — Telephone Encounter (Signed)
Pt called back in and stated that the Methylphenidate is not covered by his ins, he is not sure what is covered instead. Pt believes that Concerta 18mg  is cover under his plan and the name the name brand Adderall   Best number -[phone]

## 2018-07-10 NOTE — Telephone Encounter (Signed)
Called and spoke with patient. Pt advised and voiced understanding. Pt advised that he has been scheduled for med check on 08/17/2017 @ 4:30 PM   Sent to [place] to put patient on the schedule for [place]. Thanks

## 2018-07-13 MED ORDER — CONCERTA 18 MG PO TBCR: 18.0000 mg | EXTENDED_RELEASE_TABLET | Freq: Every day | ORAL | 0 refills | Status: DC

## 2018-07-13 NOTE — Telephone Encounter (Signed)
Concerta sent to pharmacy.  If this is not covered I will have our clinical pharmacist check with his insurance.

## 2018-07-13 NOTE — Telephone Encounter (Signed)
Called and spoke with patient. Pt advised and voiced understanding.  

## 2018-07-13 NOTE — Telephone Encounter (Signed)
Sent to PCP ?

## 2018-07-13 NOTE — Addendum Note (Signed)
Addended by: Birdie Sons, Nehan Flaum G on: 07/13/2018 10:06 AM   Modules accepted: Orders

## 2018-08-29 ENCOUNTER — Encounter: Payer: Self-pay | Admitting: Emergency Medicine

## 2018-08-29 ENCOUNTER — Other Ambulatory Visit: Payer: Self-pay

## 2018-08-29 ENCOUNTER — Emergency Department
Admission: EM | Admit: 2018-08-29 | Discharge: 2018-08-29 | Disposition: A | Discharge status: Home / Self Care | Payer: BLUE CROSS/BLUE SHIELD | Attending: Emergency Medicine | Admitting: Emergency Medicine

## 2018-08-29 DIAGNOSIS — J111 Influenza due to unidentified influenza virus with other respiratory manifestations: Secondary | ICD-10-CM

## 2018-08-29 DIAGNOSIS — R509 Fever, unspecified: Secondary | ICD-10-CM | POA: Diagnosis not present

## 2018-08-29 DIAGNOSIS — J1008 Influenza due to other identified influenza virus with other specified pneumonia: Secondary | ICD-10-CM | POA: Insufficient documentation

## 2018-08-29 DIAGNOSIS — F17210 Nicotine dependence, cigarettes, uncomplicated: Secondary | ICD-10-CM | POA: Insufficient documentation

## 2018-08-29 NOTE — ED Triage Notes (Signed)
C/o sore throat and body aches x 3 days.

## 2018-08-29 NOTE — ED Provider Notes (Signed)
[facility] Emergency Department Provider Note  ____________________________________________  Time seen: Approximately 3:01 PM  I have reviewed the triage vital signs and the nursing notes.   HISTORY  Chief Complaint No chief complaint on file.    HPI James Long is a 23 y.o. male who presents the emergency department complaining of flulike symptoms x3 days.  Patient reports fever, chills, body aches, nasal congestion, sore throat, cough.  Patient reports that symptoms seem to improve a little bit today when compared with the past 2 days.  Patient states that he is primarily here for a "work note" since he has been out of work but states that he has been managing symptoms overall with over-the-counter medications of Motrin, DayQuil and NyQuil.  Patient denies any headache, neck pain or stiffness, chest pain, domino pain, nausea vomiting, diarrhea or constipation.    Past Medical History:  Diagnosis Date  . ADHD   . Anxiety   . Clavicle fracture    left    Patient Active Problem List   Diagnosis Date Noted  . Clavicle fracture 12/15/2017  . Anxiety 08/14/2017  . ADHD 04/25/2017  . Sleeping difficulty 02/04/2016  . Encounter for general adult medical examination with abnormal findings 11/23/2015  . DDD (degenerative disc disease), lumbar 11/23/2015    Past Surgical History:  Procedure Laterality Date  . SHOULDER ARTHROSCOPY WITH DISTAL CLAVICLE RESECTION Left 12/19/2017   Procedure: LEFT CLAVICLE OPEN REDUCTION INTERNAL FIXATION;  Surgeon: Signa Kell, MD;  Location: [facility];  Service: Orthopedics;  Laterality: Left;  STRYKER CLAVICLE PLATES BEACH CHAIR POSITION WITH SPYDER SLING SHOT 2    Prior to Admission medications   Medication Sig Start Date End Date Taking? Authorizing Provider  CONCERTA 18 MG CR tablet Take 1 tablet (18 mg total) by mouth daily. 07/13/18   Glori Luis, MD  Multiple Vitamin (MULTIVITAMIN) capsule Take  1 capsule by mouth daily. Daily    [provider]    Allergies Patient has no known allergies.  No family history on file.  Social History Social History   Tobacco Use  . Smoking status: Current Every Day Smoker    Packs/day: 0.00    Years: 6.00    Pack years: 0.00    Types: E-cigarettes    Last attempt to quit: 2019    Years since quitting: 1.1  . Smokeless tobacco: Never Used  . Tobacco comment: 1 pod a day nicotin free   Substance Use Topics  . Alcohol use: Yes    Alcohol/week: 0.0 standard drinks    Comment: 4-5 week if any  . Drug use: No     Review of Systems  Constitutional: Positive fever/chills.  Positive for body aches Eyes: No visual changes. No discharge ENT: Positive for nasal congestion and sore throat Cardiovascular: no chest pain. Respiratory: Positive cough. No SOB. Gastrointestinal: No abdominal pain.  No nausea, no vomiting.  No diarrhea.  No constipation. Musculoskeletal: Negative for musculoskeletal pain. Skin: Negative for rash, abrasions, lacerations, ecchymosis. Neurological: Negative for headaches, focal weakness or numbness. 10-point ROS otherwise negative.  ____________________________________________   PHYSICAL EXAM:  VITAL SIGNS: ED Triage Vitals  Enc Vitals Group     BP 08/29/18 1451 138/76     Pulse Rate 08/29/18 1451 92     Resp 08/29/18 1451 18     Temp 08/29/18 1451 98.1 F (36.7 C)     Temp Source 08/29/18 1451 Oral     SpO2 08/29/18 1451 100 %  Weight 08/29/18 1449 163 lb 12.8 oz (74.3 kg)     Height --      Head Circumference --      Peak Flow --      Pain Score 08/29/18 1449 6     Pain Loc --      Pain Edu? --      Excl. in GC? --      Constitutional: Alert and oriented. Well appearing and in no acute distress. Eyes: Conjunctivae are normal. PERRL. EOMI. Head: Atraumatic. ENT:      Ears: EACs and TMs unremarkable bilaterally.      Nose: Mild congestion/rhinnorhea.      Mouth/Throat: Mucous  membranes are moist.  Oropharynx is mildly erythematous but nonedematous.  Tonsils are neither erythematous or edematous.  No exudates.  Uvula is midline. Neck: No stridor.  Neck is supple full range of motion Hematological/Lymphatic/Immunilogical: No cervical lymphadenopathy. Cardiovascular: Normal rate, regular rhythm. Normal S1 and S2.  Good peripheral circulation. Respiratory: Normal respiratory effort without tachypnea or retractions. Lungs CTAB. Good air entry to the bases with no decreased or absent breath sounds. Musculoskeletal: Full range of motion to all extremities. No gross deformities appreciated. Neurologic:  Normal speech and language. No gross focal neurologic deficits are appreciated.  Skin:  Skin is warm, dry and intact. No rash noted. Psychiatric: Mood and affect are normal. Speech and behavior are normal. Patient exhibits appropriate insight and judgement.   ____________________________________________   LABS (all labs ordered are listed, but only abnormal results are displayed)  Labs Reviewed - No data to display ____________________________________________  EKG   ____________________________________________  RADIOLOGY   No results found.  ____________________________________________    PROCEDURES  Procedure(s) performed:    Procedures    Medications - No data to display   ____________________________________________   INITIAL IMPRESSION / ASSESSMENT AND PLAN / ED COURSE  Pertinent labs & imaging results that were available during my care of the patient were reviewed by me and considered in my medical decision making (see chart for details).  Review of the [place] CSRS was performed in accordance of the [facility] prior to dispensing any controlled drugs.      Patient's diagnosis is consistent with influenza.  Patient presents the emergency department multiple flulike symptoms over the past 3 days.  Overall, exam was reassuring.  Patient reports that  he is managing symptoms well with over-the-counter medications.  He is requesting a work note as he has missed work with symptoms.  Patient is outside Tamiflu window.  Patient is to continue DayQuil and NyQuil and Tylenol Motrin at home.  Follow-up with primary care as needed..  Patient is given ED precautions to return to the ED for any worsening or new symptoms.     ____________________________________________  FINAL CLINICAL IMPRESSION(S) / ED DIAGNOSES  Final diagnoses:  Influenza      NEW MEDICATIONS STARTED DURING THIS VISIT:  ED Discharge Orders    None          This chart was dictated using voice recognition software/Dragon. Despite best efforts to proofread, errors can occur which can change the meaning. Any change was purely unintentional.    Racheal Patches, PA-C 08/29/18 1613    Sharman Cheek, MD 09/03/18 1250

## 2018-10-02 ENCOUNTER — Other Ambulatory Visit: Payer: Self-pay

## 2018-10-02 ENCOUNTER — Encounter: Payer: Self-pay | Admitting: Family Medicine

## 2018-10-02 ENCOUNTER — Ambulatory Visit (INDEPENDENT_AMBULATORY_CARE_PROVIDER_SITE_OTHER): Payer: BLUE CROSS/BLUE SHIELD | Admitting: Family Medicine

## 2018-10-02 DIAGNOSIS — F909 Attention-deficit hyperactivity disorder, unspecified type: Secondary | ICD-10-CM | POA: Diagnosis not present

## 2018-10-02 NOTE — Progress Notes (Signed)
Virtual Visit via Video Note  I connected with James Long on 10/03/18 at  4:00 PM EDT by a video enabled telemedicine application and verified that I am speaking with the correct person using two identifiers. Location patient: patients work Location provider: work Persons participating in the virtual visit: patient, provider  I discussed the limitations of evaluation and management by telemedicine and the availability of in person appointments. The patient expressed understanding and agreed to proceed.  Visit is for follow-up of ADHD  HPI: ADHD: Patient notes the Concerta seems to not work quite as well as the Vyvanse did.  He tried staying on it for a week and then coming off of it for a week and then going back on it to see if that helped significantly.  He notes difficulty focusing.  He notes there is not been the same benefit.  He notes no appetite suppression, sleep changes, or palpitations.  He notes his heart rate on his smart watch was 85.   ROS: See pertinent positives and negatives per HPI.  Past Medical History:  Diagnosis Date  . ADHD   . Anxiety   . Clavicle fracture    left    Past Surgical History:  Procedure Laterality Date  . SHOULDER ARTHROSCOPY WITH DISTAL CLAVICLE RESECTION Left 12/19/2017   Procedure: LEFT CLAVICLE OPEN REDUCTION INTERNAL FIXATION;  Surgeon: Signa Kell, MD;  Location: [facility];  Service: Orthopedics;  Laterality: Left;  STRYKER CLAVICLE PLATES BEACH CHAIR POSITION WITH SPYDER SLING SHOT 2    No family history on file.  SOCIAL HX: smoker    Current Outpatient Medications:  .  methylphenidate (CONCERTA) 36 MG PO CR tablet, Take 1 tablet (36 mg total) by mouth daily., Disp: 30 tablet, Rfl: 0  EXAM:  VITALS per patient if applicable: hr on smart watch 85  GENERAL: alert, oriented, appears well and in no acute distress  HEENT: atraumatic, conjunttiva clear, no obvious abnormalities on inspection of external nose and  ears  NECK: normal movements of the head and neck  LUNGS: on inspection no signs of respiratory distress, breathing rate appears normal, no obvious gross SOB, gasping or wheezing  CV: no obvious cyanosis  MS: moves all visible extremities without noticeable abnormality  PSYCH/NEURO: pleasant and cooperative, no obvious depression or anxiety, speech and thought processing grossly intact  ASSESSMENT AND PLAN:  Discussed the following assessment and plan:  Attention deficit hyperactivity disorder (ADHD), unspecified ADHD type  ADHD Established problem.  Worsen symptoms on current dose of Concerta.  We will increase Concerta as outlined below.  He will contact us in 2 to 3 weeks to see how he is tolerating it and let us know what his pulse has been at home.    I discussed the assessment and treatment plan with the patient. The patient was provided an opportunity to ask questions and all were answered. The patient agreed with the plan and demonstrated an understanding of the instructions.   The patient was advised to call back or seek an in-person evaluation if the symptoms worsen or if the condition fails to improve as anticipated.  I provided 12 minutes of non-face-to-face time during this encounter.   Marikay Alar, MD

## 2018-10-03 MED ORDER — METHYLPHENIDATE HCL ER (OSM) 36 MG PO TBCR: 36.0000 mg | EXTENDED_RELEASE_TABLET | Freq: Every day | ORAL | 0 refills | Status: DC

## 2018-10-03 NOTE — Assessment & Plan Note (Signed)
Established problem.  Worsen symptoms on current dose of Concerta.  We will increase Concerta as outlined below.  He will contact us in 2 to 3 weeks to see how he is tolerating it and let us know what his pulse has been at home.

## 2018-11-05 ENCOUNTER — Telehealth: Payer: Self-pay | Admitting: Family Medicine

## 2018-11-05 NOTE — Telephone Encounter (Signed)
Copied from CRM [id number]. Topic: Quick Communication - Rx Refill/Question >> Nov 05, 2018  2:27 PM Fanny Bien wrote: Medication methylphenidate (CONCERTA) 36 MG PO CR tablet [[id number]]   Has the patient contacted their pharmacy? Preferred Pharmacy (with phone number or street name):[id number] [place], [place] - [address] AT [address] OF [address] [phone] (Phone) [phone] (Fax)  Agent: Please be advised that RX refills may take up to 3 business days. We ask that you follow-up with your pharmacy.

## 2018-11-08 NOTE — Telephone Encounter (Signed)
Please find out if the patient has been able to check his pulse and BP since we increased the dosage of this medication. Please also see if he has had any appetite suppression, palpitations, or sleep issues with the increased dose.

## 2018-11-09 NOTE — Telephone Encounter (Signed)
He has been checking his BP and has pulse and he states he is having no issues with the increase, no appetite suppression, no sleep issues and no heart papitations.  He states everything is fine.  Marino Rogerson,cma

## 2018-11-09 NOTE — Telephone Encounter (Signed)
Please find out if he has any numbers for his BP and pulse. Thanks.

## 2018-11-10 MED ORDER — METHYLPHENIDATE HCL ER (OSM) 36 MG PO TBCR: 36.0000 mg | EXTENDED_RELEASE_TABLET | Freq: Every day | ORAL | 0 refills | Status: AC

## 2018-11-10 NOTE — Telephone Encounter (Signed)
Noted. Refill sent to pharmacy. He will need follow-up in 2-3 months. Ok to be scheduled in a same day 15 minute slot.

## 2018-11-10 NOTE — Telephone Encounter (Signed)
Pt is calling to check the status of this.

## 2018-11-10 NOTE — Telephone Encounter (Signed)
Pt states he does not know how to do his BP he has no cuff, but his pulse he has number. Today it is 71 he states It   fluctuates between 65-75 today and last week it was 70-85 sometimes it will reach 90.  But not over 90.  James Long,cma

## 2018-11-11 NOTE — Telephone Encounter (Signed)
Called and informed pt that RX was sent to pharmacy and I scheduled a F/up appt in 3-4 months with the pt. And he understood and agreed to date and time.  Jeannia Tatro,cma

## 2018-11-27 IMAGING — CR DG ELBOW COMPLETE 3+V*L*
1 series · 4 of 4 positions shown · non-contrast
Comparison: None.

CLINICAL DATA: Fall this morning, left elbow pain

EXAM:
LEFT ELBOW - COMPLETE 3+ VIEW

[Series 1: dg elbow complete left (3+view) · 0.14mm/px · 4 of 4 slices shown]
[im 1/4]
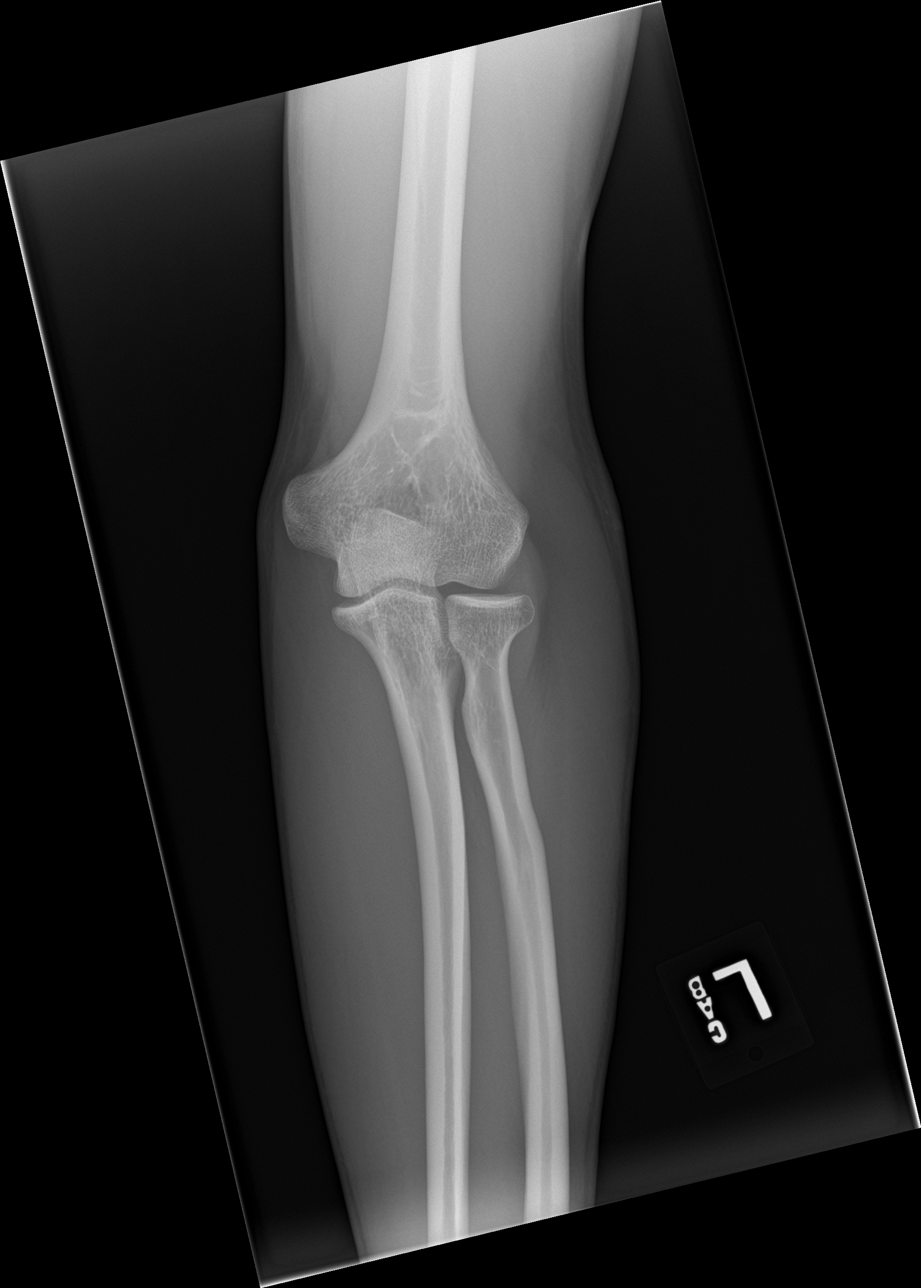
[im 2/4]
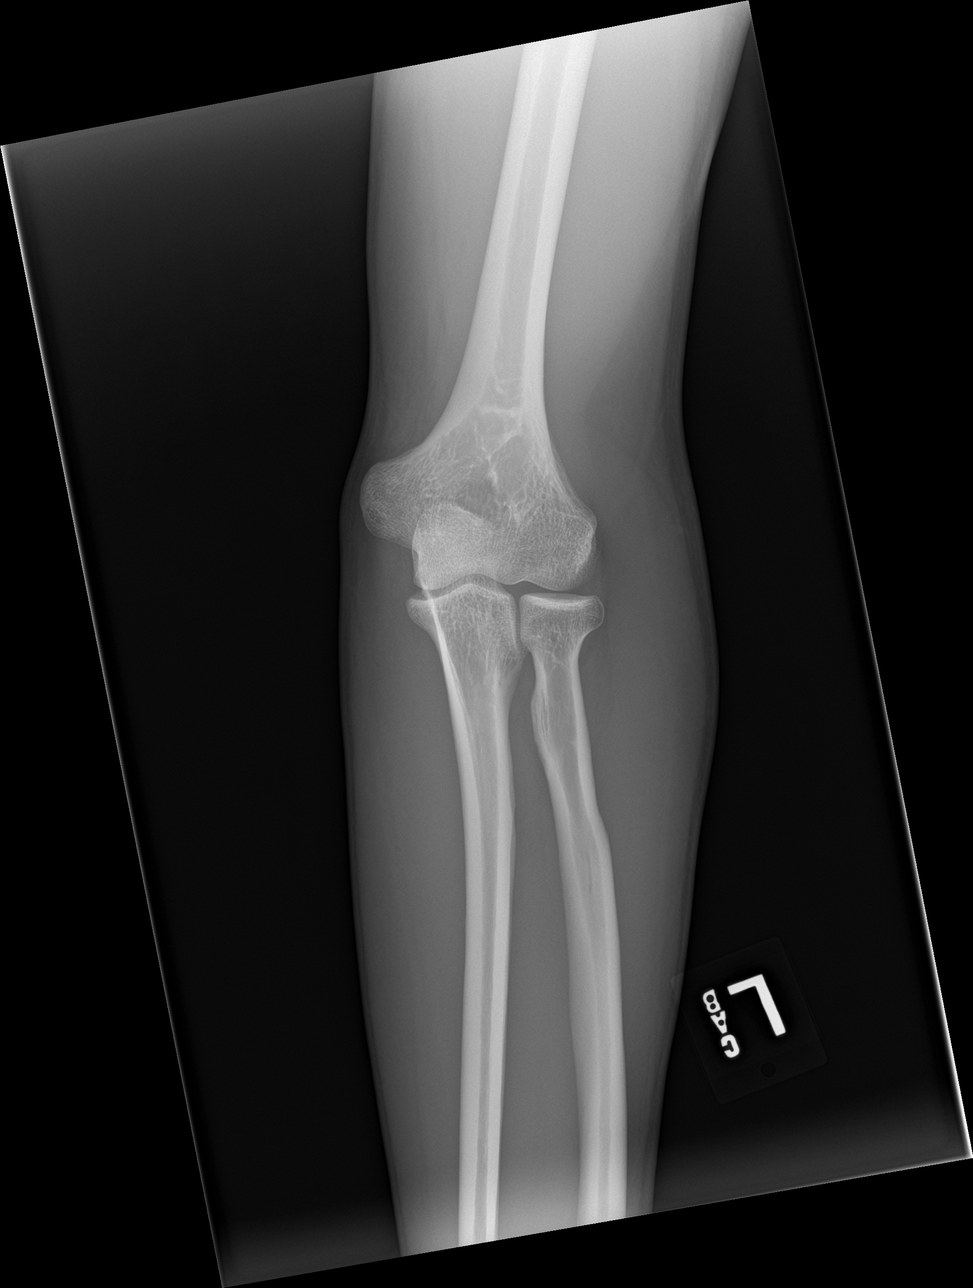
[im 3/4]
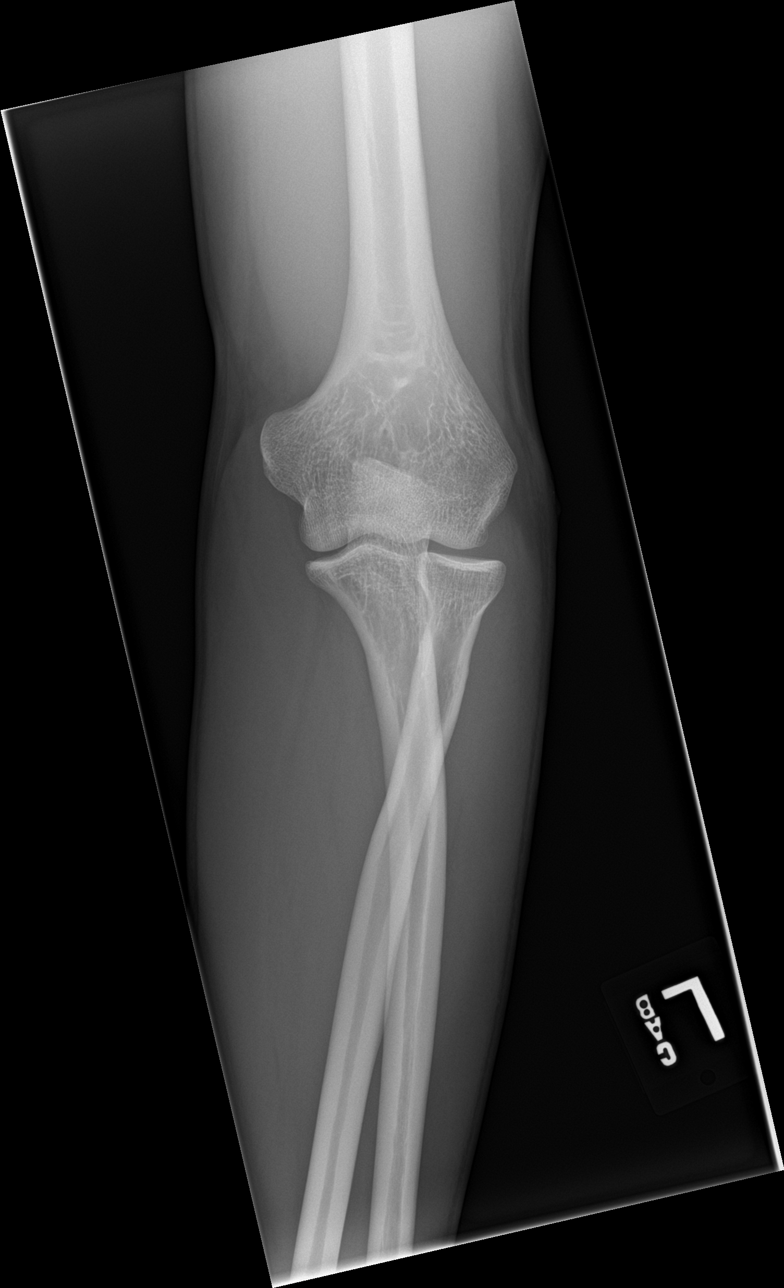
[im 4/4]
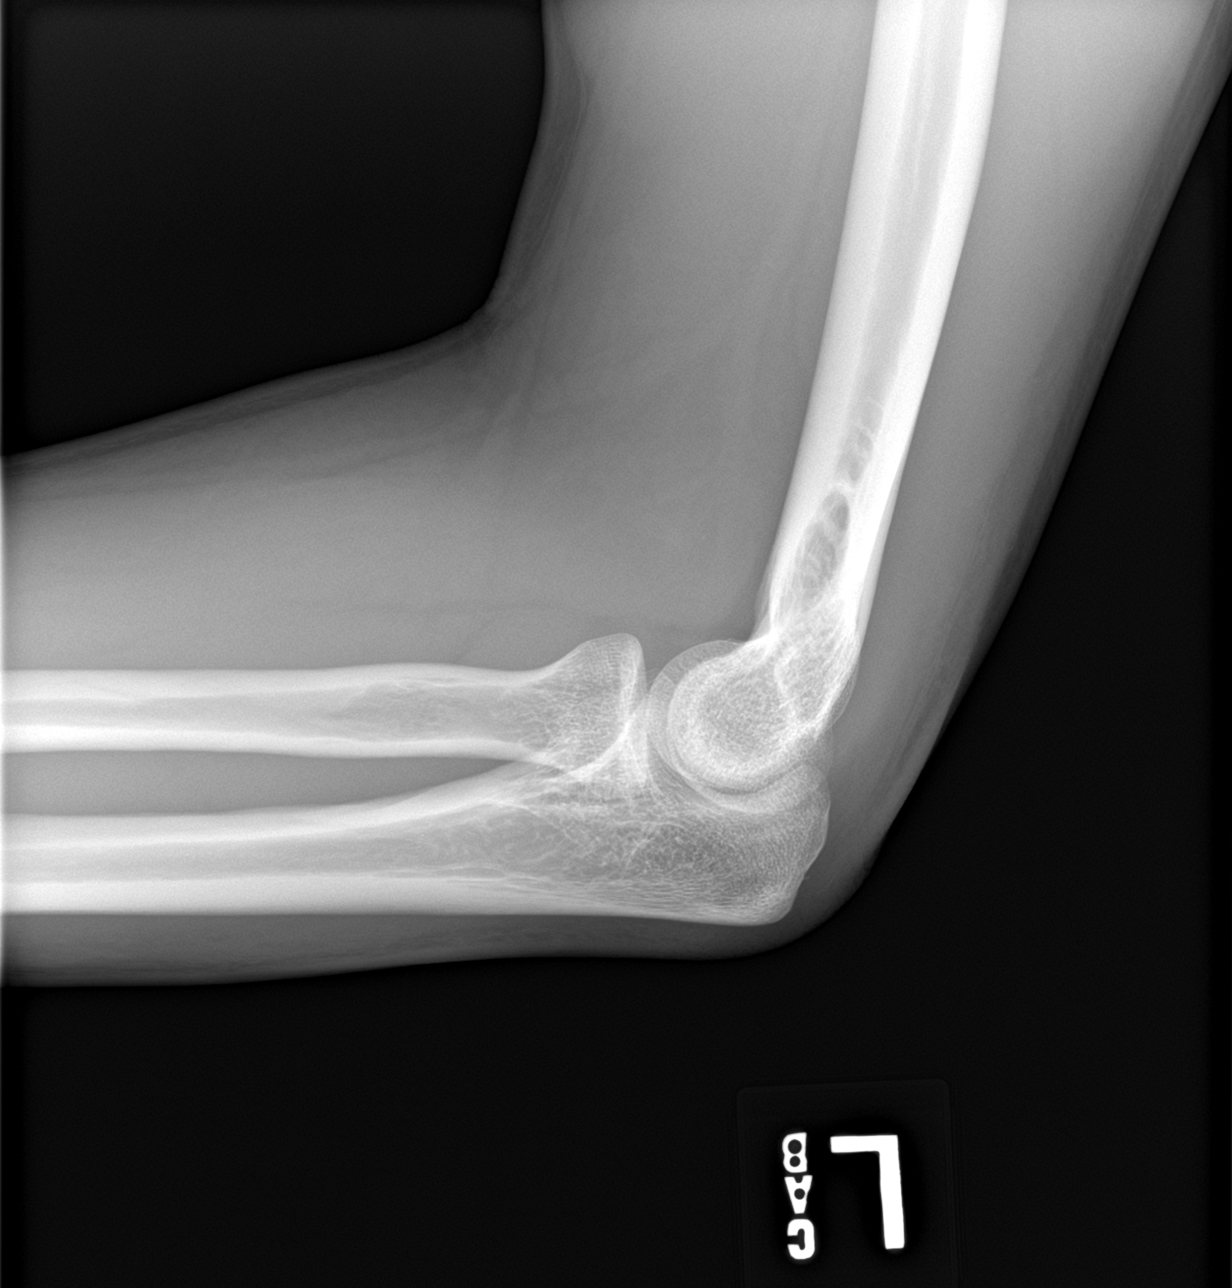

[4 of 4 positions shown; findings below may reference images not displayed]

FINDINGS: No fracture, joint effusion or dislocation. No significant
arthropathy. No suspicious focal osseous lesion. No radiopaque
foreign body.
IMPRESSION: No left elbow fracture, joint effusion or malalignment.

## 2019-02-16 ENCOUNTER — Ambulatory Visit: Payer: BLUE CROSS/BLUE SHIELD | Admitting: Family Medicine

## 2019-02-16 DIAGNOSIS — Z0289 Encounter for other administrative examinations: Secondary | ICD-10-CM
